# Patient Record
Sex: Male | Born: 1955 | Race: White | Hispanic: No | Marital: Single
Health system: Southern US, Community
[De-identification: ages and names within clinical notes are randomized; demographics above are authoritative.]

## PROBLEM LIST (undated history)

## (undated) ENCOUNTER — Emergency Department (HOSPITAL_COMMUNITY): Admission: EM | Payer: Self-pay | Source: Home / Self Care

---

## 1999-02-17 ENCOUNTER — Ambulatory Visit (HOSPITAL_COMMUNITY): Admission: RE | Admit: 1999-02-17 | Discharge: 1999-02-17 | Payer: Self-pay | Admitting: Neurosurgery

## 1999-02-17 ENCOUNTER — Encounter: Payer: Self-pay | Admitting: Neurosurgery

## 1999-02-25 ENCOUNTER — Inpatient Hospital Stay (HOSPITAL_COMMUNITY): Admission: RE | Admit: 1999-02-25 | Discharge: 1999-02-26 | Payer: Self-pay | Admitting: Neurosurgery

## 1999-02-25 ENCOUNTER — Encounter: Payer: Self-pay | Admitting: Neurosurgery

## 1999-04-22 ENCOUNTER — Inpatient Hospital Stay (HOSPITAL_COMMUNITY): Admission: RE | Admit: 1999-04-22 | Discharge: 1999-04-25 | Payer: Self-pay | Admitting: Neurosurgery

## 2000-03-31 ENCOUNTER — Encounter: Admission: RE | Admit: 2000-03-31 | Discharge: 2000-03-31 | Payer: Self-pay | Admitting: Neurosurgery

## 2000-03-31 ENCOUNTER — Encounter: Payer: Self-pay | Admitting: Neurosurgery

## 2000-04-08 ENCOUNTER — Encounter: Payer: Self-pay | Admitting: Neurosurgery

## 2000-04-08 ENCOUNTER — Ambulatory Visit (HOSPITAL_COMMUNITY): Admission: RE | Admit: 2000-04-08 | Discharge: 2000-04-08 | Payer: Self-pay | Admitting: Neurosurgery

## 2000-04-30 ENCOUNTER — Encounter: Payer: Self-pay | Admitting: Neurosurgery

## 2000-05-04 ENCOUNTER — Encounter: Payer: Self-pay | Admitting: Neurosurgery

## 2000-05-04 ENCOUNTER — Ambulatory Visit (HOSPITAL_COMMUNITY): Admission: RE | Admit: 2000-05-04 | Discharge: 2000-05-05 | Payer: Self-pay | Admitting: Neurosurgery

## 2000-06-02 ENCOUNTER — Encounter (HOSPITAL_COMMUNITY): Admission: RE | Admit: 2000-06-02 | Discharge: 2000-07-02 | Payer: Self-pay | Admitting: Neurosurgery

## 2001-03-11 ENCOUNTER — Ambulatory Visit (HOSPITAL_COMMUNITY): Admission: RE | Admit: 2001-03-11 | Discharge: 2001-03-11 | Payer: Self-pay | Admitting: Neurosurgery

## 2001-03-11 ENCOUNTER — Encounter: Payer: Self-pay | Admitting: Neurosurgery

## 2001-03-17 ENCOUNTER — Encounter: Payer: Self-pay | Admitting: Neurosurgery

## 2001-03-18 ENCOUNTER — Encounter: Payer: Self-pay | Admitting: Neurosurgery

## 2001-03-18 ENCOUNTER — Inpatient Hospital Stay (HOSPITAL_COMMUNITY): Admission: RE | Admit: 2001-03-18 | Discharge: 2001-03-21 | Payer: Self-pay | Admitting: Neurosurgery

## 2005-04-08 ENCOUNTER — Ambulatory Visit (HOSPITAL_COMMUNITY): Admission: RE | Admit: 2005-04-08 | Discharge: 2005-04-08 | Payer: Self-pay | Admitting: Family Medicine

## 2005-04-28 ENCOUNTER — Ambulatory Visit (HOSPITAL_COMMUNITY): Admission: RE | Admit: 2005-04-28 | Discharge: 2005-04-28 | Payer: Self-pay | Admitting: Pulmonary Disease

## 2005-06-03 ENCOUNTER — Ambulatory Visit (HOSPITAL_COMMUNITY): Admission: RE | Admit: 2005-06-03 | Discharge: 2005-06-03 | Payer: Self-pay | Admitting: *Deleted

## 2005-06-17 ENCOUNTER — Inpatient Hospital Stay (HOSPITAL_COMMUNITY): Admission: EM | Admit: 2005-06-17 | Discharge: 2005-06-23 | Payer: Self-pay | Admitting: *Deleted

## 2005-07-14 ENCOUNTER — Ambulatory Visit: Payer: Self-pay | Admitting: Infectious Diseases

## 2005-07-14 ENCOUNTER — Encounter: Payer: Self-pay | Admitting: Emergency Medicine

## 2005-07-14 ENCOUNTER — Inpatient Hospital Stay (HOSPITAL_COMMUNITY): Admission: AD | Admit: 2005-07-14 | Discharge: 2005-07-29 | Payer: Self-pay | Admitting: Cardiovascular Disease

## 2005-11-05 ENCOUNTER — Inpatient Hospital Stay (HOSPITAL_COMMUNITY)
Admission: AD | Admit: 2005-11-05 | Discharge: 2005-11-17 | Payer: Self-pay | Admitting: Thoracic Surgery (Cardiothoracic Vascular Surgery)

## 2005-12-07 ENCOUNTER — Encounter
Admission: RE | Admit: 2005-12-07 | Discharge: 2005-12-07 | Payer: Self-pay | Admitting: Thoracic Surgery (Cardiothoracic Vascular Surgery)

## 2006-03-22 ENCOUNTER — Ambulatory Visit: Payer: Self-pay | Admitting: Thoracic Surgery (Cardiothoracic Vascular Surgery)

## 2006-06-21 ENCOUNTER — Emergency Department (HOSPITAL_COMMUNITY): Admission: EM | Admit: 2006-06-21 | Discharge: 2006-06-21 | Payer: Self-pay | Admitting: Obstetrics & Gynecology

## 2006-07-13 ENCOUNTER — Ambulatory Visit (HOSPITAL_COMMUNITY): Admission: RE | Admit: 2006-07-13 | Discharge: 2006-07-13 | Payer: Self-pay | Admitting: Family Medicine

## 2006-07-31 ENCOUNTER — Emergency Department (HOSPITAL_COMMUNITY): Admission: EM | Admit: 2006-07-31 | Discharge: 2006-07-31 | Payer: Self-pay | Admitting: Emergency Medicine

## 2006-10-08 ENCOUNTER — Ambulatory Visit: Payer: Self-pay | Admitting: Internal Medicine

## 2006-10-19 ENCOUNTER — Ambulatory Visit (HOSPITAL_COMMUNITY): Admission: RE | Admit: 2006-10-19 | Discharge: 2006-10-19 | Payer: Self-pay | Admitting: Internal Medicine

## 2006-10-19 ENCOUNTER — Encounter: Payer: Self-pay | Admitting: Internal Medicine

## 2006-10-19 ENCOUNTER — Ambulatory Visit: Payer: Self-pay | Admitting: Internal Medicine

## 2006-11-23 ENCOUNTER — Ambulatory Visit: Payer: Self-pay | Admitting: Internal Medicine

## 2007-06-16 ENCOUNTER — Inpatient Hospital Stay (HOSPITAL_COMMUNITY): Admission: EM | Admit: 2007-06-16 | Discharge: 2007-06-19 | Payer: Self-pay | Admitting: Emergency Medicine

## 2007-06-17 ENCOUNTER — Encounter (INDEPENDENT_AMBULATORY_CARE_PROVIDER_SITE_OTHER): Payer: Self-pay | Admitting: Family Medicine

## 2007-06-24 ENCOUNTER — Ambulatory Visit (HOSPITAL_COMMUNITY): Admission: RE | Admit: 2007-06-24 | Discharge: 2007-06-24 | Payer: Self-pay | Admitting: Family Medicine

## 2007-07-01 ENCOUNTER — Ambulatory Visit (HOSPITAL_COMMUNITY): Admission: RE | Admit: 2007-07-01 | Discharge: 2007-07-01 | Payer: Self-pay | Admitting: Family Medicine

## 2007-10-04 ENCOUNTER — Ambulatory Visit (HOSPITAL_COMMUNITY): Admission: RE | Admit: 2007-10-04 | Discharge: 2007-10-04 | Payer: Self-pay | Admitting: Family Medicine

## 2007-11-07 IMAGING — CR DG CHEST 1V PORT
1 series · 1 of 1 positions shown · non-contrast
Comparison: [DATE].

CLINICAL DATA: CABG, follow-up. 
 PORTABLE CHEST - 1 VIEW ? 06/19/05 (0050 HOURS):

[view not recorded]
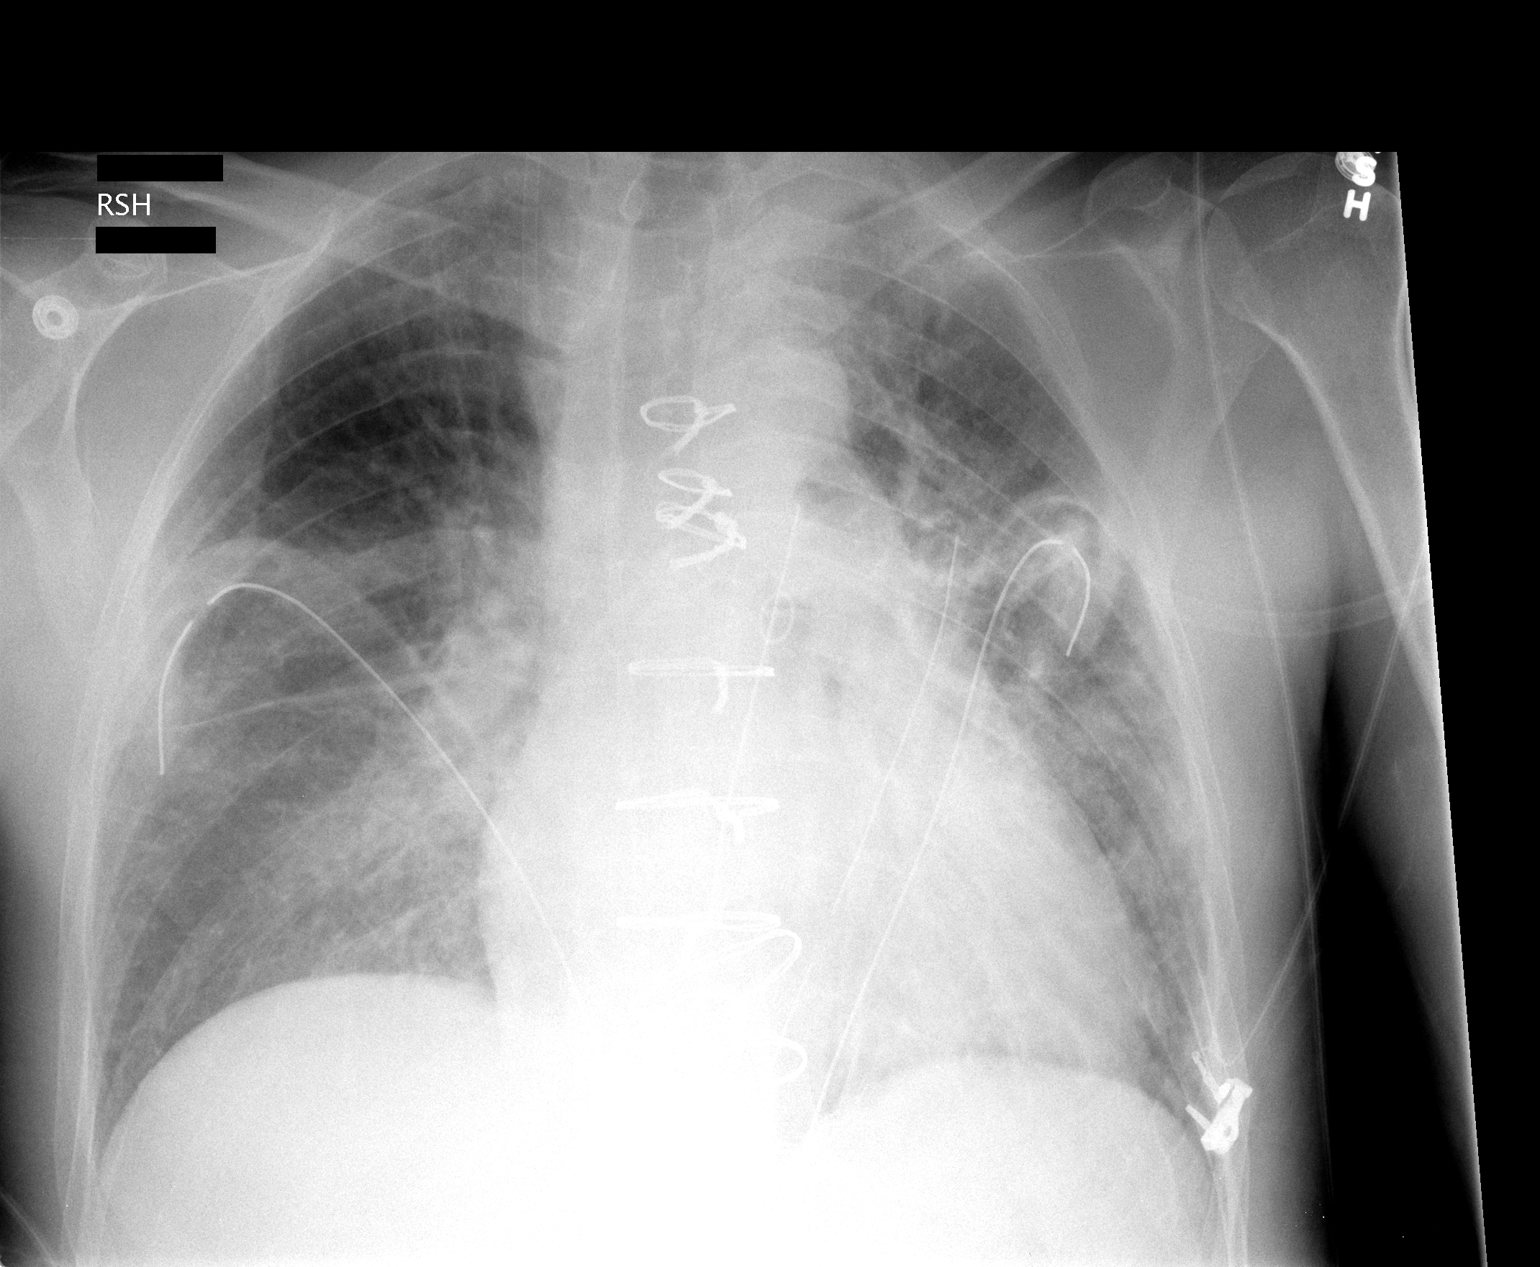

[1 of 1 positions shown; findings below may reference images not displayed]

Endotracheal tube has been removed.  A Swan-Ganz catheter has been removed but the venous access sheath remains in place.  Bilateral chest tubes and mediastinal drains remain in place.  There is no pneumothorax. There is worsening bilateral lung density which could be edema or aspiration.  I think edema is more likely.
IMPRESSION: As discussed above.

## 2007-11-10 IMAGING — CR DG CHEST 2V
2 series · 2 of 2 positions shown · non-contrast
Comparison: 06/21/05 and CT chest 04/28/05

CLINICAL DATA: Shortness of breath, chest pain and weakness.
 CHEST ? 2 VIEW:

[view not recorded (1 of 2)]
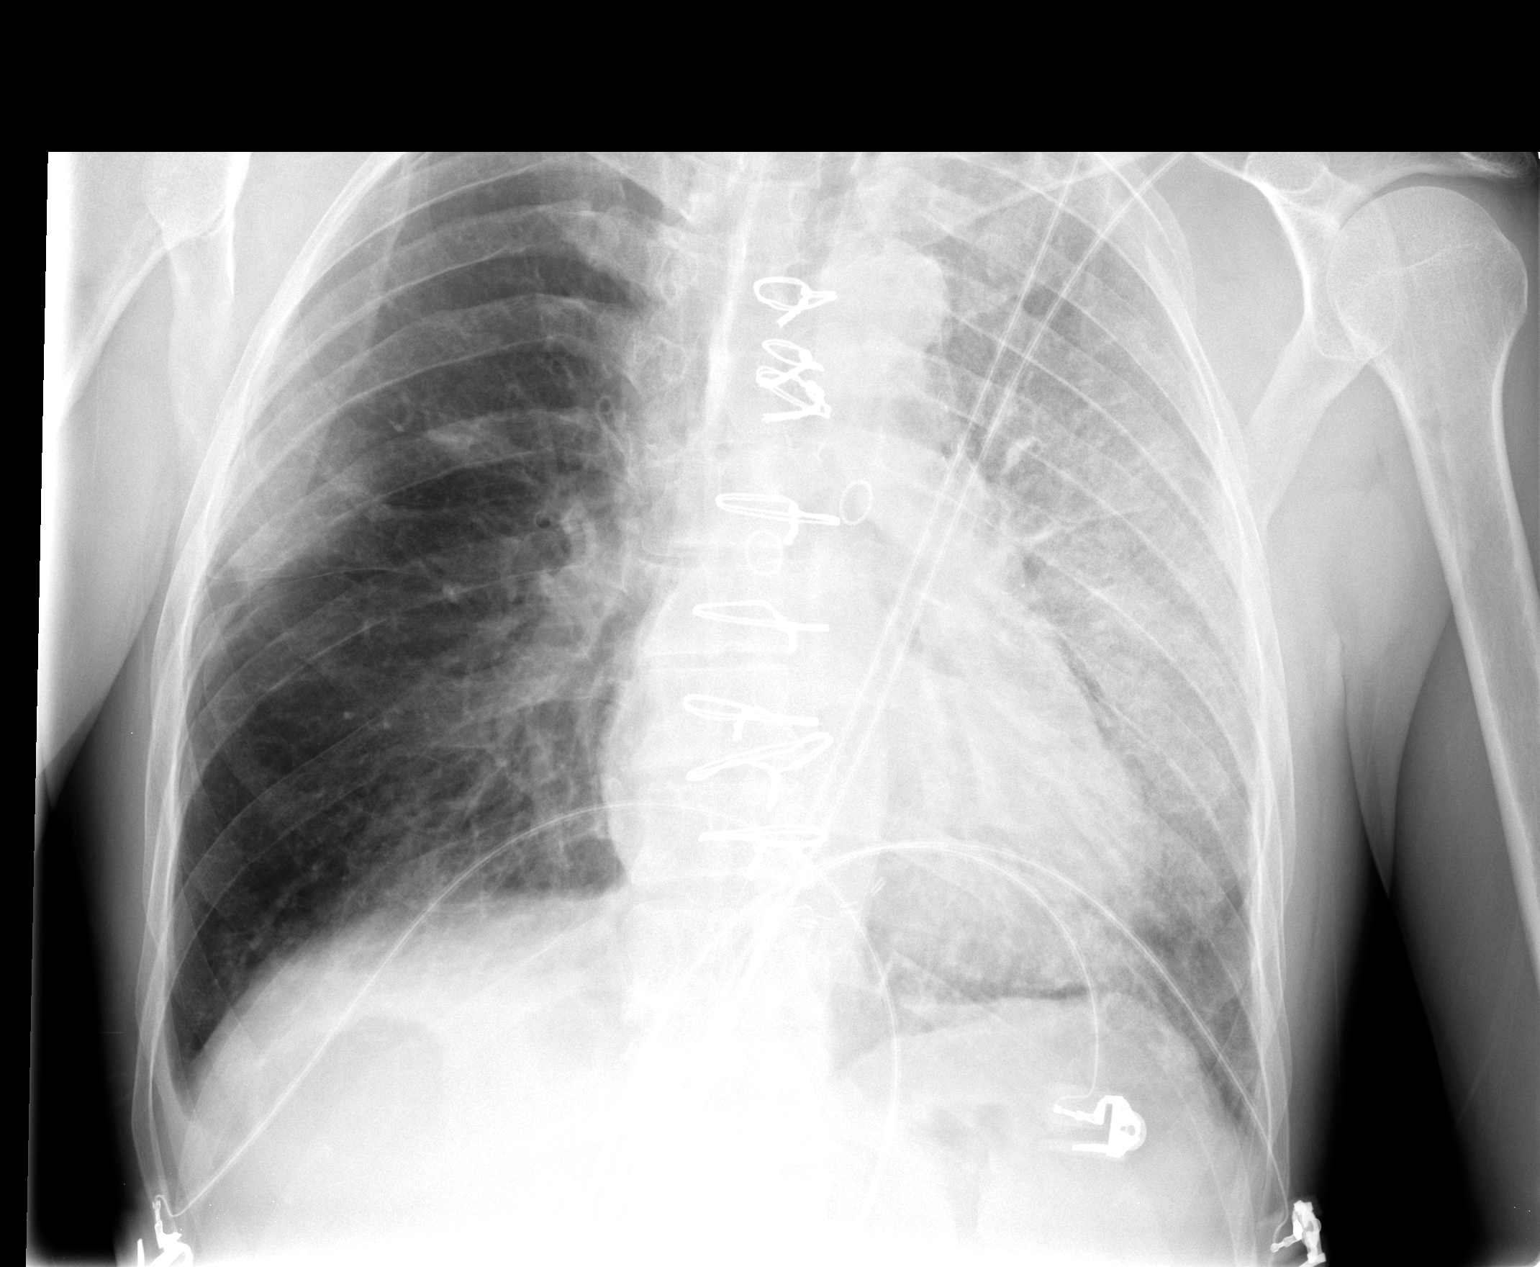

[view not recorded (2 of 2)]
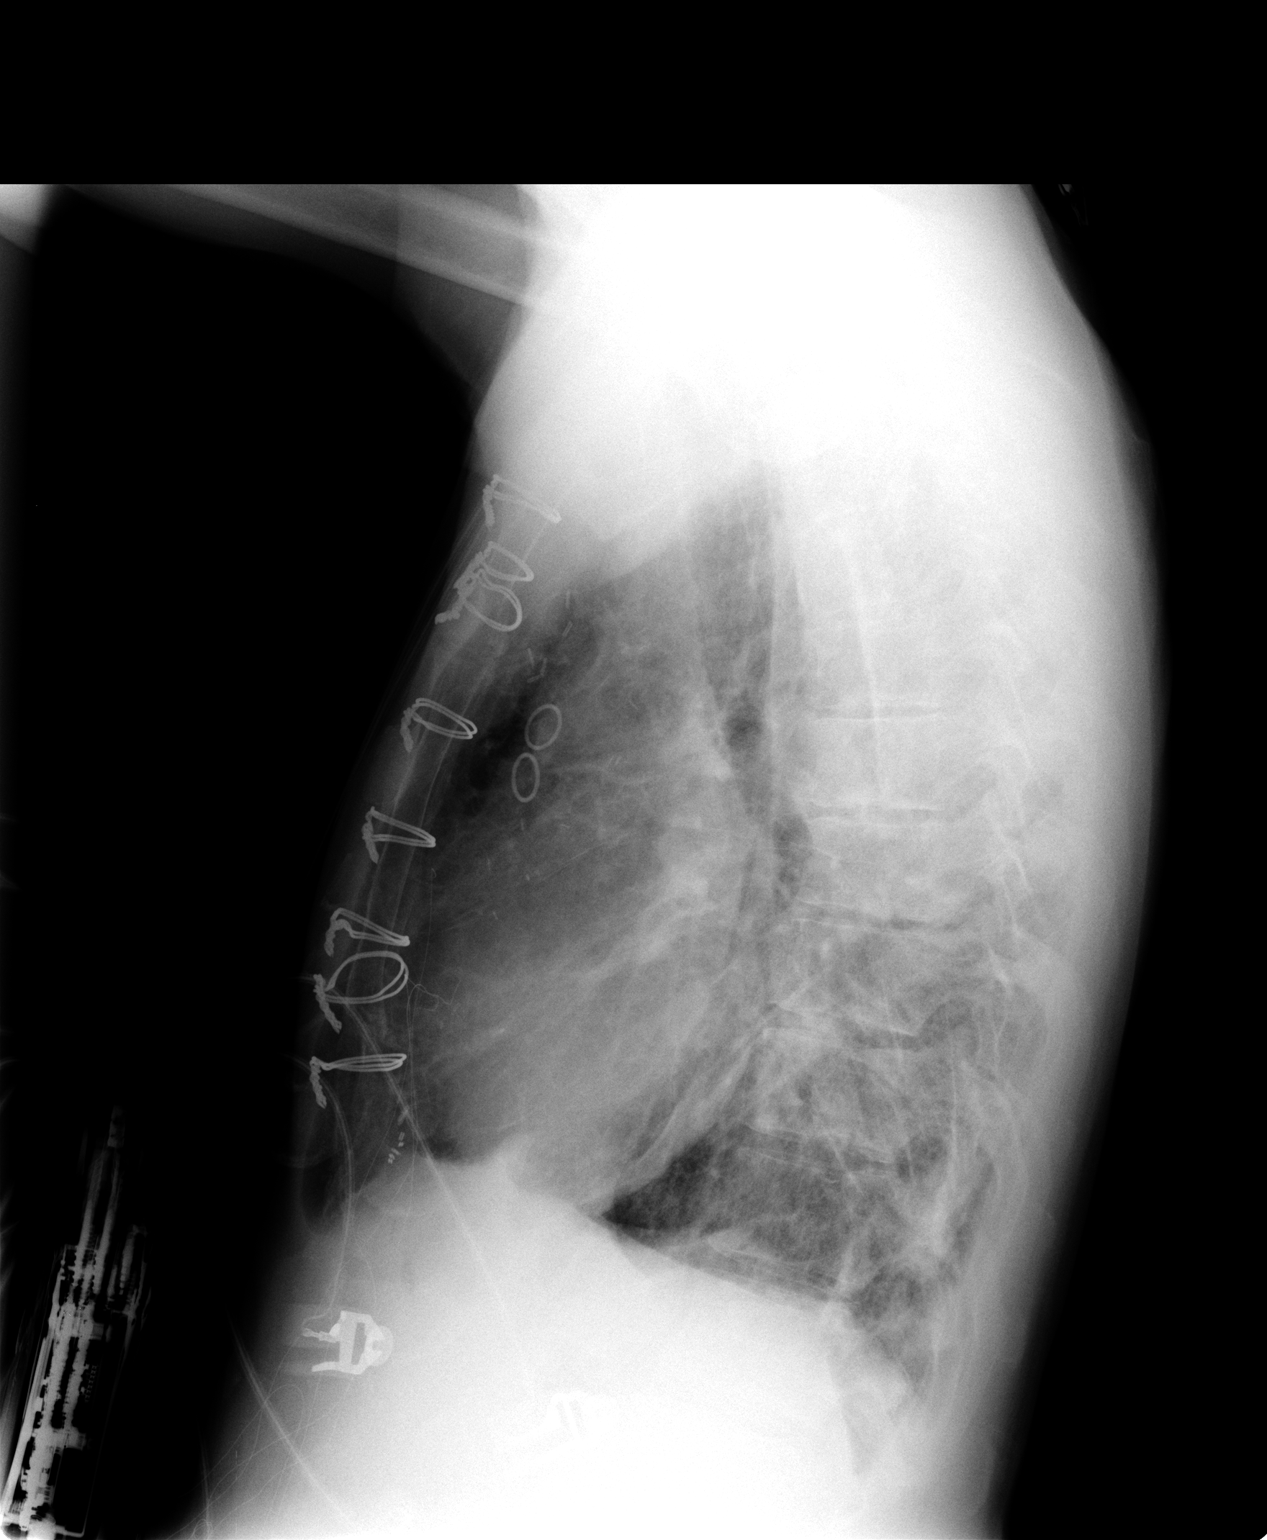

[2 of 2 positions shown; findings below may reference images not displayed]

FINDINGS: Extensive air space disease is seen in the left upper lobe.  Small bilateral pneumothoraces persist.  Air space disease is also seen in the posterior segment of the right upper lobe.  Bibasilar atelectasis.  No pleural fluid.
IMPRESSION: 1.  Persistent left upper lobe air space disease.  Developing air space disease in the posterior segment right upper lobe.  Findings are worrisome for pneumonia.
 2.  Small bilateral pneumothoraces.

## 2008-04-13 ENCOUNTER — Ambulatory Visit (HOSPITAL_COMMUNITY): Admission: RE | Admit: 2008-04-13 | Discharge: 2008-04-13 | Payer: Self-pay | Admitting: Family Medicine

## 2008-08-05 ENCOUNTER — Emergency Department (HOSPITAL_COMMUNITY): Admission: EM | Admit: 2008-08-05 | Discharge: 2008-08-05 | Payer: Self-pay | Admitting: Emergency Medicine

## 2008-11-08 IMAGING — CR DG CHEST 2V
2 series · 2 of 2 positions shown · non-contrast
Comparison: 12/07/05.

CLINICAL DATA: Shortness of breath/weakness.  
 CHEST - 2 VIEW:

[view not recorded (1 of 2)]
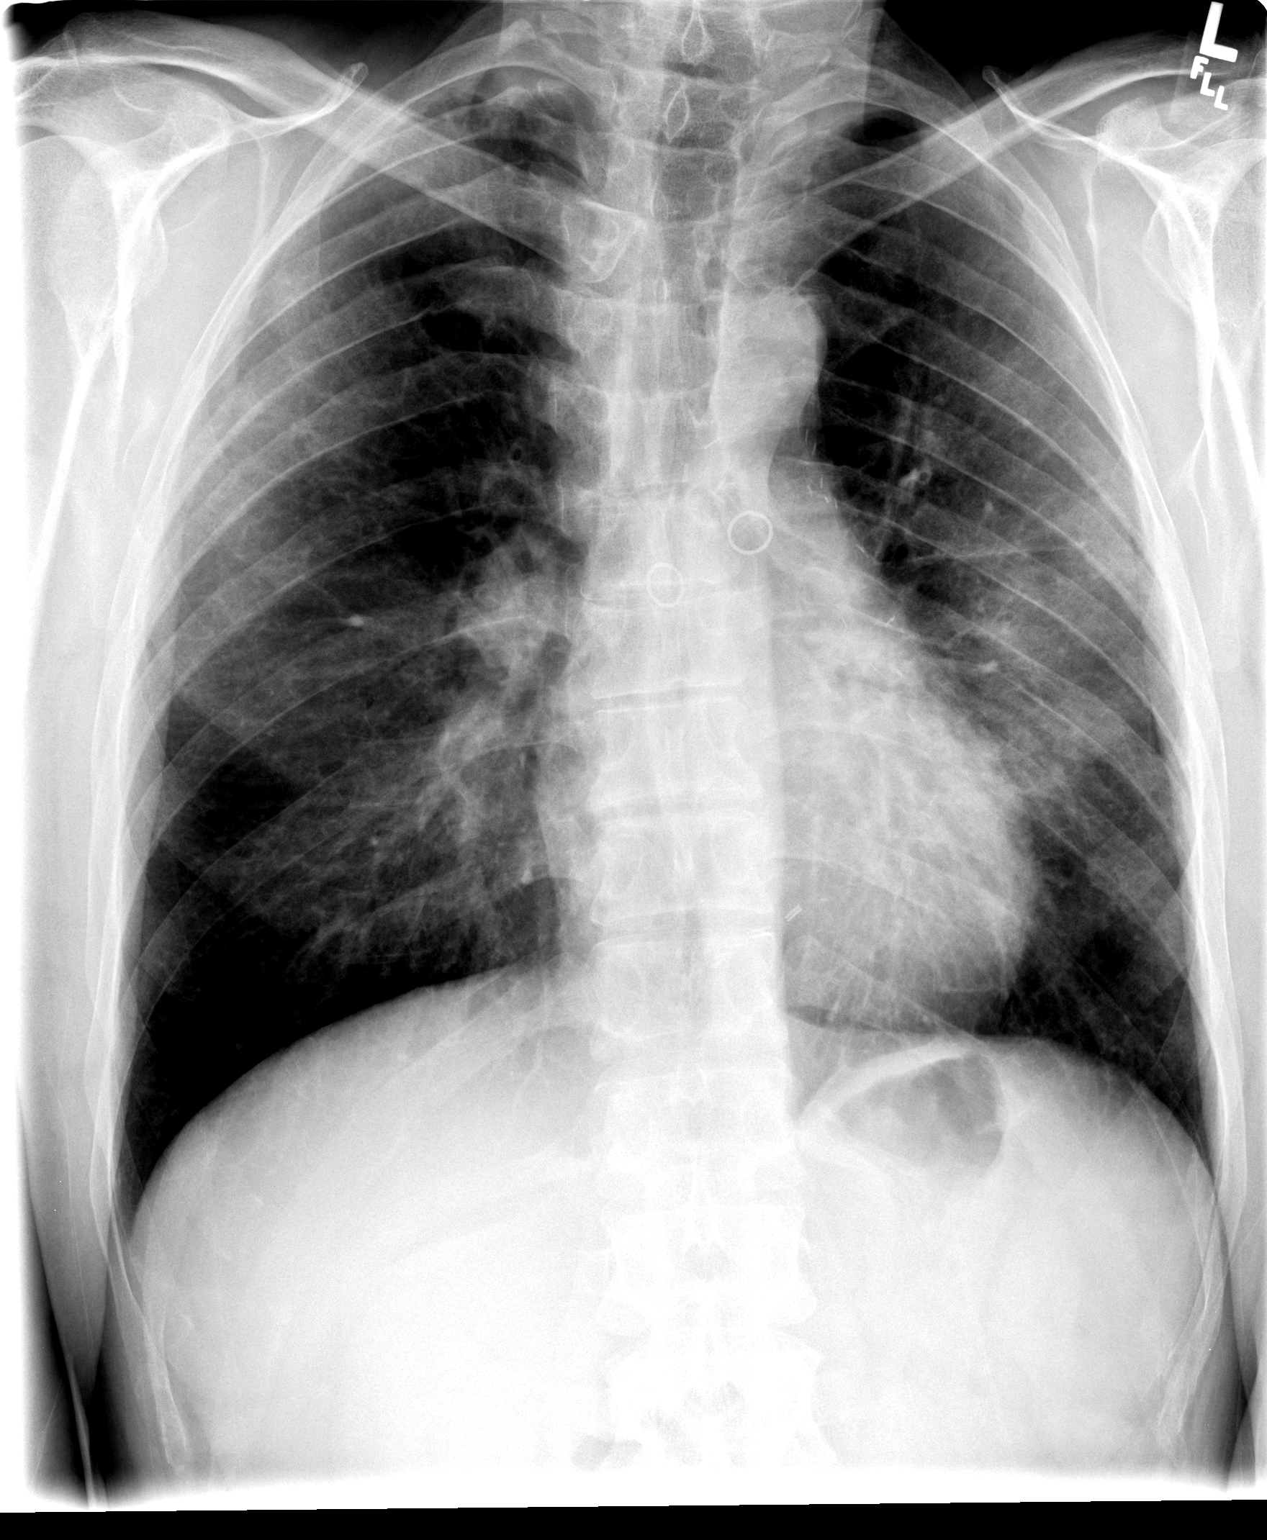

[view not recorded (2 of 2)]
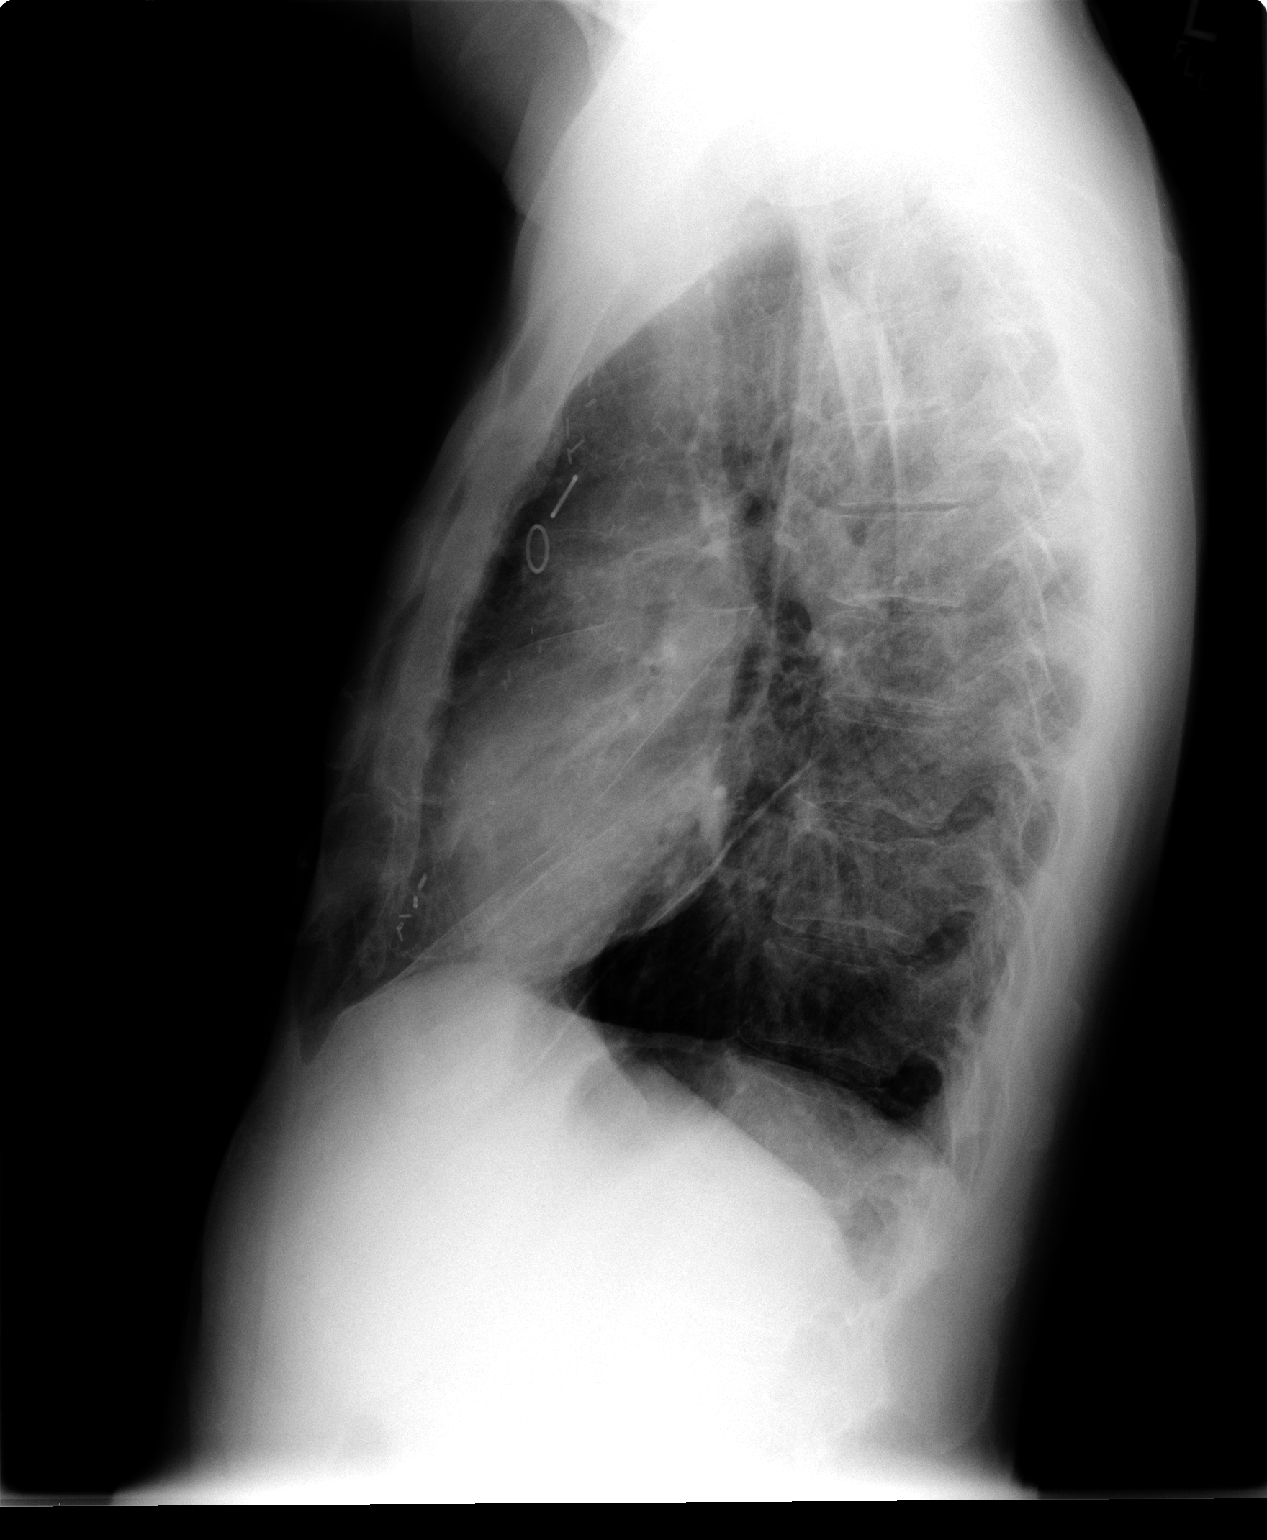

[2 of 2 positions shown; findings below may reference images not displayed]

FINDINGS: Heart size within normal limits.  Prior CABG.   There is faint pulmonary opacification in the right upper lobe and left perihilar region suspicious for acute pneumonia.  There is underlying COPD which is stable.
IMPRESSION: 1.  Faint right upper lobe and left perihilar infiltrates, likely acute pneumonia. 
 2.  Prior CABG and COPD.

## 2008-11-12 ENCOUNTER — Ambulatory Visit (HOSPITAL_COMMUNITY): Admission: RE | Admit: 2008-11-12 | Discharge: 2008-11-12 | Payer: Self-pay | Admitting: Cardiovascular Disease

## 2009-01-21 ENCOUNTER — Ambulatory Visit (HOSPITAL_COMMUNITY): Admission: RE | Admit: 2009-01-21 | Discharge: 2009-01-21 | Payer: Self-pay | Admitting: Family Medicine

## 2009-03-12 ENCOUNTER — Ambulatory Visit (HOSPITAL_COMMUNITY): Admission: RE | Admit: 2009-03-12 | Discharge: 2009-03-12 | Payer: Self-pay | Admitting: Family Medicine

## 2009-05-12 ENCOUNTER — Ambulatory Visit: Payer: Self-pay | Admitting: Cardiology

## 2009-10-11 ENCOUNTER — Inpatient Hospital Stay (HOSPITAL_COMMUNITY): Admission: EM | Admit: 2009-10-11 | Discharge: 2009-10-13 | Payer: Self-pay | Admitting: Emergency Medicine

## 2009-10-28 ENCOUNTER — Ambulatory Visit (HOSPITAL_COMMUNITY): Admission: RE | Admit: 2009-10-28 | Discharge: 2009-10-28 | Payer: Self-pay | Admitting: Family Medicine

## 2009-12-10 ENCOUNTER — Emergency Department (HOSPITAL_COMMUNITY): Admission: EM | Admit: 2009-12-10 | Discharge: 2009-12-10 | Payer: Self-pay | Admitting: Emergency Medicine

## 2010-03-02 ENCOUNTER — Encounter: Payer: Self-pay | Admitting: Thoracic Surgery (Cardiothoracic Vascular Surgery)

## 2010-03-12 DEATH — deceased

## 2010-04-22 LAB — BLOOD GAS, ARTERIAL
Bicarbonate: 21.4 mEq/L (ref 20.0–24.0)
FIO2: 0.21 %
Patient temperature: 37
pCO2 arterial: 31 mmHg — ABNORMAL LOW (ref 35.0–45.0)
pH, Arterial: 7.454 — ABNORMAL HIGH (ref 7.350–7.450)

## 2010-04-22 LAB — CBC
HCT: 39 % (ref 39.0–52.0)
MCHC: 34 g/dL (ref 30.0–36.0)
Platelets: 180 10*3/uL (ref 150–400)
RDW: 14.7 % (ref 11.5–15.5)
WBC: 8.8 10*3/uL (ref 4.0–10.5)

## 2010-04-22 LAB — COMPREHENSIVE METABOLIC PANEL
AST: 14 U/L (ref 0–37)
Albumin: 3.8 g/dL (ref 3.5–5.2)
Alkaline Phosphatase: 60 U/L (ref 39–117)
BUN: 20 mg/dL (ref 6–23)
Chloride: 108 mEq/L (ref 96–112)
Potassium: 3.4 mEq/L — ABNORMAL LOW (ref 3.5–5.1)
Sodium: 141 mEq/L (ref 135–145)
Total Protein: 6.7 g/dL (ref 6.0–8.3)

## 2010-04-22 LAB — DIFFERENTIAL
Eosinophils Absolute: 0.5 10*3/uL (ref 0.0–0.7)
Lymphs Abs: 1.6 10*3/uL (ref 0.7–4.0)
Monocytes Absolute: 0.5 10*3/uL (ref 0.1–1.0)
Monocytes Relative: 6 % (ref 3–12)
Neutrophils Relative %: 69 % (ref 43–77)

## 2010-04-22 LAB — POCT CARDIAC MARKERS
CKMB, poc: <1 ng/mL — ABNORMAL LOW (ref 1.0–8.0)
Troponin i, poc: <0.05 ng/mL (ref 0.00–0.09)

## 2010-04-24 LAB — CULTURE, BLOOD (ROUTINE X 2)
Culture: NO GROWTH
Report Status: 9072011

## 2010-04-24 LAB — URINE CULTURE

## 2010-04-24 LAB — IRON AND TIBC
Iron: 99 ug/dL (ref 42–135)
Saturation Ratios: 39 % (ref 20–55)
TIBC: 257 ug/dL (ref 215–435)
UIBC: 158 ug/dL

## 2010-04-24 LAB — URINALYSIS, ROUTINE W REFLEX MICROSCOPIC
Bilirubin Urine: NEGATIVE
Ketones, ur: NEGATIVE mg/dL
Nitrite: NEGATIVE
Protein, ur: NEGATIVE mg/dL
Urobilinogen, UA: 0.2 mg/dL (ref 0.0–1.0)

## 2010-04-24 LAB — BLOOD GAS, ARTERIAL
Bicarbonate: 20.2 mEq/L (ref 20.0–24.0)
TCO2: 18.3 mmol/L (ref 0–100)
pCO2 arterial: 33.5 mmHg — ABNORMAL LOW (ref 35.0–45.0)
pH, Arterial: 7.398 (ref 7.350–7.450)
pO2, Arterial: 147 mmHg — ABNORMAL HIGH (ref 80.0–100.0)

## 2010-04-24 LAB — CBC
HCT: 34.7 % — ABNORMAL LOW (ref 39.0–52.0)
Hemoglobin: 11.3 g/dL — ABNORMAL LOW (ref 13.0–17.0)
Hemoglobin: 11.6 g/dL — ABNORMAL LOW (ref 13.0–17.0)
MCH: 31.8 pg (ref 26.0–34.0)
MCHC: 33.4 g/dL (ref 30.0–36.0)
Platelets: 199 10*3/uL (ref 150–400)
Platelets: 202 10*3/uL (ref 150–400)
RBC: 3.67 MIL/uL — ABNORMAL LOW (ref 4.22–5.81)
RDW: 12.7 % (ref 11.5–15.5)
WBC: 13.5 10*3/uL — ABNORMAL HIGH (ref 4.0–10.5)
WBC: 6.7 10*3/uL (ref 4.0–10.5)

## 2010-04-24 LAB — COMPREHENSIVE METABOLIC PANEL
ALT: 18 U/L (ref 0–53)
Calcium: 9 mg/dL (ref 8.4–10.5)
Glucose, Bld: 83 mg/dL (ref 70–99)
Sodium: 136 mEq/L (ref 135–145)
Total Protein: 5.8 g/dL — ABNORMAL LOW (ref 6.0–8.3)

## 2010-04-24 LAB — BASIC METABOLIC PANEL
BUN: 11 mg/dL (ref 6–23)
CO2: 22 mEq/L (ref 19–32)
Chloride: 108 mEq/L (ref 96–112)
Creatinine, Ser: 0.95 mg/dL (ref 0.4–1.5)
GFR calc Af Amer: >60 mL/min (ref >60)
GFR calc non Af Amer: >60 mL/min (ref >60)
Glucose, Bld: 113 mg/dL — ABNORMAL HIGH (ref 70–99)
Potassium: 4 mEq/L (ref 3.5–5.1)
Sodium: 138 mEq/L (ref 135–145)
Sodium: 138 mEq/L (ref 135–145)

## 2010-04-24 LAB — FERRITIN: Ferritin: 305 ng/mL (ref 22–322)

## 2010-04-24 LAB — DIFFERENTIAL
Basophils Absolute: 0 10*3/uL (ref 0.0–0.1)
Eosinophils Relative: 1 % (ref 0–5)
Lymphocytes Relative: 11 % — ABNORMAL LOW (ref 12–46)
Lymphocytes Relative: 2 % — ABNORMAL LOW (ref 12–46)
Lymphocytes Relative: 5 % — ABNORMAL LOW (ref 12–46)
Lymphs Abs: 0.4 10*3/uL — ABNORMAL LOW (ref 0.7–4.0)
Lymphs Abs: 0.9 10*3/uL (ref 0.7–4.0)
Monocytes Absolute: 0.4 10*3/uL (ref 0.1–1.0)
Neutro Abs: 12.8 10*3/uL — ABNORMAL HIGH (ref 1.7–7.7)
Neutrophils Relative %: 94 % — ABNORMAL HIGH (ref 43–77)

## 2010-04-24 LAB — POCT CARDIAC MARKERS
CKMB, poc: 5 ng/mL (ref 1.0–8.0)
Myoglobin, poc: 149 ng/mL (ref 12–200)
Troponin i, poc: <0.05 ng/mL (ref 0.00–0.09)

## 2010-04-24 LAB — LACTIC ACID, PLASMA: Lactic Acid, Venous: 1.3 mmol/L (ref 0.5–2.2)

## 2010-04-24 LAB — CARDIAC PANEL(CRET KIN+CKTOT+MB+TROPI)
CK, MB: 2.2 ng/mL (ref 0.3–4.0)
CK, MB: 3.9 ng/mL (ref 0.3–4.0)
Relative Index: INVALID (ref 0.0–2.5)
Total CK: 63 U/L (ref 7–232)
Total CK: 78 U/L (ref 7–232)
Troponin I: 0.05 ng/mL (ref 0.00–0.06)

## 2010-04-24 LAB — ETHANOL: Alcohol, Ethyl (B): <5 mg/dL (ref 0–10)

## 2010-04-24 LAB — MRSA PCR SCREENING: MRSA by PCR: POSITIVE — AB

## 2010-05-19 LAB — CBC
HCT: 43.9 % (ref 39.0–52.0)
Hemoglobin: 15.3 g/dL (ref 13.0–17.0)
MCV: 96.8 fL (ref 78.0–100.0)
RBC: 4.54 MIL/uL (ref 4.22–5.81)
WBC: 13.4 10*3/uL — ABNORMAL HIGH (ref 4.0–10.5)

## 2010-05-19 LAB — DIFFERENTIAL
Eosinophils Absolute: 0.4 10*3/uL (ref 0.0–0.7)
Eosinophils Relative: 3 % (ref 0–5)
Lymphs Abs: 0.8 10*3/uL (ref 0.7–4.0)
Monocytes Absolute: 0.2 10*3/uL (ref 0.1–1.0)
Monocytes Relative: 2 % — ABNORMAL LOW (ref 3–12)

## 2010-05-19 LAB — BASIC METABOLIC PANEL
BUN: 7 mg/dL (ref 6–23)
Chloride: 105 mEq/L (ref 96–112)
GFR calc Af Amer: >60 mL/min (ref >60)
Potassium: 3 mEq/L — ABNORMAL LOW (ref 3.5–5.1)

## 2010-05-19 LAB — POCT CARDIAC MARKERS
CKMB, poc: <1 ng/mL — ABNORMAL LOW (ref 1.0–8.0)
Myoglobin, poc: 72.4 ng/mL (ref 12–200)

## 2010-06-24 NOTE — Discharge Summary (Signed)
[name] [name], [name] [name]:  [id number]   MEDICAL RECORD NO.:  [id number]          PATIENT TYPE:  INP   LOCATION:  A222                          FACILITY:  [facility]   PHYSICIAN:  Lucita Ferrara, MD         DATE OF BIRTH:  Dec 19, 1955   DATE OF ADMISSION:  06/16/2007  DATE OF DISCHARGE:  05/10/2009LH                               DISCHARGE SUMMARY   ADDENDUM   Note, the patient is to hold home medication dose of nitroglycerin due  to his presentation of hypotension upon admission date.      Lucita Ferrara, MD  Electronically Signed     RR/MEDQ  D:  06/19/2007  T:  06/19/2007  Job:  [id number]

## 2010-06-24 NOTE — Group Therapy Note (Signed)
NAME:  [name] [name], [name] [name]:  [id number]   MEDICAL RECORD NO.:  [id number]          PATIENT TYPE:  INP   LOCATION:  A222                          FACILITY:  [facility]   PHYSICIAN:  Lucita Ferrara, MD         DATE OF BIRTH:  1955-10-31   DATE OF PROCEDURE:  DATE OF DISCHARGE:                                 PROGRESS NOTE   Note:  Physical examination, previous progress notes reviewed.  The  patient denies any recurrent blackout episodes.  Denies any chest pain  or shortness of breath.  Denies any weakness.  He was already evaluated  by neurology.  EKG shows no arrhythmias, no events.  Carotid ultrasound:  Right carotid artery shows no stenosis.  The left, no stenosis.  Impression was mild bilateral carotid bulb plaque without any evidence  of hemodynamically significant stenosis.  CT of the head negative.   OBJECTIVE:  The patient's temperature 97.8, pulse 72, respirations 16,  blood pressure 133/78, pulse oximetry 99% on room air.  HEENT:  Normocephalic, atraumatic.  Sclerae anicteric.  CARDIOVASCULAR:  S1, S2.  Regular rate and rhythm.  ABDOMEN:  Soft, nontender, nondistended, positive bowel sounds.  LUNGS:  Clear to auscultation.  No rhonchi, rales or wheezes.  NEUROLOGIC:  The patient alert and oriented x3.  Cranial nerves II-XII  grossly intact.   LABORATORY DATA:  The patient had an RPR negative.  Folate level 9.1,  within normal limits.  T3 normal.  Vitamin B12 normal.  Free T4 low at  8.82.  C-reactive protein 0.3, low.  Magnesium 1.9.  Complete metabolic  panel shows a mildly low blood albumin of 3.0.  Again, his TSH is 0.253,  which is mildly low.  Radiological results above.  Again, his carotid  Doppler is negative.  CT scan of the head negative.  Initial EKG within  normal limits.   ASSESSMENT AND PLAN:  1. Syncopal episode and blacking out that is sudden.  Unclear      etiology.  Seems like his metabolic workup including his Z61 and      folate is within  normal limits.  Although he is clinically folate      deficient, given his chronic alcohol use, his syncopal episode      could just well be secondary to this or to active marijuana use.      Rule out seizure disorder.  The patient will be scheduled for an      EEG.  The rest of the plan will be dependent on neurology      recommendations if his EEG is positive.  2. Mild protein-calorie malnutrition.  Suspect this is likely      secondary to his alcohol use.  Will go ahead and get a dietary      consult.  Also, the patient was empirically initiated on B12 injection and  thiamine.  Will continue PT/OT, get the rest of his medications at home.   Note that his cardiogenic reason for any kind of syncope was pretty much  ruled out with a negative echocardiogram,  consultation that recommended  no further workup.  The patient's discharge will be in the next 1 or 2  days, depending on his EEG results.  Thereafter, discharge plan  dependent of patients progress.      Lucita Ferrara, MD  Electronically Signed     RR/MEDQ  D:  06/18/2007  T:  06/18/2007  Job:  [id number]

## 2010-06-24 NOTE — Discharge Summary (Signed)
[name], [name] [name]                 ACCOUNT NO.:  [id number]   MEDICAL RECORD NO.:  [id number]          PATIENT TYPE:  INP   LOCATION:  A222                          FACILITY:  [facility]   PHYSICIAN:  Lucita Ferrara, MD         DATE OF BIRTH:  06/04/1955   DATE OF ADMISSION:  06/16/2007  DATE OF DISCHARGE:  LH                               DISCHARGE SUMMARY   ADDENDUM:   DISCHARGE MEDICATIONS:  1. Amitriptyline 50 mg p.o. nightly.  2. Metoprolol 50 mg p.o. b.i.d.  3. Zocor 20 mg p.o. nightly.  4. Thiamine 100 mg p.o. daily.  5. Valium 5 mg p.o. q.12 hours as needed.  6. Advair Diskus, per admission dose prior to admission, twice daily.  7. Simvastatin 20 mg p.o. daily.  8. Megestrol 40 mg p.o. b.i.d.  9. Ferrous sulfate 225 mg once daily.  10.Neurontin 300 mg three times a day.  11.Folic acid 1 mg p.o. daily.   Note that entire medications should be reconciliated and doses should be  verified by primary care doctor.      Lucita Ferrara, MD  Electronically Signed     RR/MEDQ  D:  06/19/2007  T:  06/19/2007  Job:  [id number]   cc:   Patrica Duel, M.D.  Fax: [phone]

## 2010-06-24 NOTE — Consult Note (Signed)
NAME:  [name] [name], [name] [name]                 ACCOUNT NO.:  [id number]   MEDICAL RECORD NO.:  [id number]          PATIENT TYPE:  INP   LOCATION:  A222                          FACILITY:  [facility]   PHYSICIAN:  Kofi A. Gerilyn Pilgrim, M.D. DATE OF BIRTH:  11-20-1955   DATE OF CONSULTATION:  06/18/2007  DATE OF DISCHARGE:                                 CONSULTATION   REASON FOR CONSULTATION:  Syncope.   HISTORY OF PRESENT ILLNESS:  The patient is a 55 year old white male who  presents to the hospital with about 3 spells of syncope.  He had a  recent event, which led him to be taken to the emergency room.  He  endorses drinking alcoholic beverages, although he said it was just 1  beer the day that the most latest event occurred.  He was found by EMS  to be hypotensive however and was given 500 liters of fluid en route to  the emergency room.  The patient apparently fell during the most recent  event and reports low back pain.  The patient's other events seems  similar in semiology to the most recent event.  He probably just blacks  out and loses consciousness.  He thinks he is out for a few minutes.  This spell has been witnessed by his family.  He indicates that no one  has told him that he shakes, jerks, just simply blacks out.  There is no  trauma to the tongue or lip.  There is no urinary incontinence.  No  bowel incontinence.  No focal neurological signs, diplopia, or chest  pain.  No shortness of breath associated with these spells.  He does  report to having chronic headaches from time to time.   PAST MEDICAL HISTORY:  1. Myocardial infarction/coronary artery disease.  2. Hypertension.  3. Hyperlipidemia.  4. Gastroesophageal reflux disease  5. MRSA.   ALLERGIES:  None known.   ADMISSION MEDICATIONS:  1. Metoprolol 50 mg b.i.d.  2. Folic acid 1 mg daily.  3. Amitriptyline 50 mg at bedtime.  4. Neurontin 300 mg t.i.d.  5. Ferrous sulfate 325 daily.  6. Megace 40 mg b.i.d.  7. Simvastatin  20 mg once a day.  8. Advair.  9. Nitroglycerin p.r.n.  10.Valium 10 mg p.r.n.  11.Sertraline.  12.Omeprazole 20 mg  13.Vicodin 5.   SOCIAL HISTORY:  Currently smokes.  Charts indicate that he is a heavy  drinker, although the patient reports only drinking a couple of beers or  so few times a week.  He denies heavy liquor drinking.   REVIEW OF SYSTEMS:  No fevers.  No focal neurological symptoms.  He does  report dizziness and lightheadedness with these events, but denies  diplopia.  Otherwise, review of systems are unchanged from Dr. Claudean Severance  on Jun 16, 2007.   PHYSICAL EXAMINATION:  A thin, somewhat malnourish-appearing man, in no  acute distress.  Temperature 97.6, pulse 72, respirations 18, and blood pressure 111/66.  HEENT:  Head is normocephalic and atraumatic.  NECK:  Supple.  ABDOMEN:  Soft.  CHEST:  Midline sternal incision appears  to have been healed by  secondary intention.  EXTREMITIES:  No significant edema.  NEUROLOGIC:  Mentation, the patient is awake and alert.  He converses  and is lucid.  He does seem to have some restricted insight into his  medical condition.  Speech, language, and cognition generally  unremarkable.  Cranial nerve evaluation:  The patient has significant  gaze-evoked choppy nystagmus in all directions.  He does seem to have  some apraxia of eye movement.  He does have full extraocular movements;  however, visual fields are intact.  Pupils are equal, round, and  reactive to light.  Facial muscle strength is symmetric and normal.  Tongue is midline.  Shoulder shrug is normal.  Motor examination shows  normal bulk and strength.  There appears to be some slightly increased  tone in the legs.  Reflexes are brisk in the legs and slightly brisk in  the upper extremities.  Cognition shows no past-pointing or dysmetria.  There is no parkinsonism.  Gait is wide based, but there is no  spasticity and actually relatively stable.  Ambulation without  assistive  devices.   Head CT scan is normal.  Carotid Dopplers shows no hemodynamic  significant stenosis.   IMPRESSION:  1. Recurrent syncope of unclear etiology.  Given the hypotension and      the history of alcohol use as heavy, I suggested that this is      likely causative.  I doubt that he has a primary neurologic problem      explaining his syncope; however, given the relatively poor history,      probably reasonable to get an EEG.  2. Clinical evidence of thiamine deficiency.  We will go ahead and      replace thiamine starting tonight.  I will suggest that he takes      this chronically given his chronic alcoholic abuse.  3. Subtle signs of myelopathy in clinical evaluation.  This is also      could be due to nutritional deficiency particularly from vitamin      B12.  We will check the levels and have this replaced.   Thank you for this consultation.      Kofi A. Gerilyn Pilgrim, M.D.  Electronically Signed     KAD/MEDQ  D:  06/18/2007  T:  06/18/2007  Job:  [id number]

## 2010-06-24 NOTE — Op Note (Signed)
NAME:  [name] [name], [name] [name]                 ACCOUNT NO.:  [id number]   MEDICAL RECORD NO.:  [id number]          PATIENT TYPE:  AMB   LOCATION:  DAY                           FACILITY:  [facility]   PHYSICIAN:  R. Roetta Sessions, M.D. DATE OF BIRTH:  1955/08/25   DATE OF PROCEDURE:  10/19/2006  DATE OF DISCHARGE:                               OPERATIVE REPORT   PROCEDURE:  Colonoscopy screening followed by esophagogastroduodenoscopy  with biopsy.   INDICATIONS FOR PROCEDURE:  The patient is 55 year old gentleman sent  over courtesy of Dr. Patrica Duel for further evaluation of anemia.  He  has a history of normocytic anemia and as far as I know, there has been  no documentation he has been GI bleeding.  In fact he has no upper or  lower GI tract symptoms.  There is no family history of colorectal  neoplasia.  He has never had his lower GI tract evaluated.  Colonoscopy  is now being done largely as a screening maneuver.  His iron studies  been found to be normal.  If colonoscopy is unrevealing we will plan for  an EGD.  This approach has been discussed the patient previously.  Potential risks, benefits and alternatives have been reviewed, questions  answered.  He is agreeable.  Please see the documentation medical  record.  He does have a history of a gastric ulcer disease found in 1997  Dr. Karilyn Cota.  He was previously treated for H pylori based on serologies.  He is status post triple drug therapy.   PROCEDURE NOTE:  O2 saturation, blood pressure, pulse and respirations  were monitored throughout entirety of procedure.   CONSCIOUS SEDATION:  Versed 7 mg IV, Demerol 150 mg IV in divided doses  for both procedures.   INSTRUMENTATION:  Pentax video chip system.   FINDINGS:  Digital rectal exam revealed no abnormalities.   ENDOSCOPIC FINDINGS:  The prep was good.  Colon:  Colonic mucosa was surveyed from rectosigmoid junction through  the left, transverse, right colon to the area appendiceal  orifice  ileocecal valve and cecum.  These structures well seen and photographed  for the record.  Terminal ileum is intubated to 10 cm, from this level  scope was withdrawn.  All previous mucosal surfaces were again seen.  The colonic mucosa, terminal ileum mucosa appeared normal.  Scope was  pulled down to the rectum.  Thorough exam of rectal mucosa including  retroflex view of the anal verge demonstrated no abnormalities.  The  patient tolerated procedure well was prepared for EGD.   FINDINGS:  Examination of tubular esophagus revealed a 5 cm solitary  distal esophageal erosion going down the EG junction where it  intersected with a 5 cm EG junction ulcer.  There is no Barrett's  esophagus.  There was a noncritical Schatzki's ring.  There was no  evidence of tumor.  EG junction easily traversed.  Stomach:  Gastric cavity was empty, insufflated well with air.  Thorough  examination gastric mucosa including retroflexion proximal stomach,  esophagogastric junction demonstrated a small hiatal hernia and multiple  tiny antral  erosions with superficial ulceration.  Please see photos.  These appeared to be benign lesions.  There was no infiltrating process.  Pylorus patent, easily traversed.  Examination bulb, second portion  revealed no abnormalities.   THERAPEUTIC/DIAGNOSTIC MANEUVERS PERFORMED:  Antral biopsies were taken  for histologic study.  The patient tolerated the procedure well, was  reacted in endoscopy.   IMPRESSION:  Colonoscopy findings:  Normal rectum, colon, terminal  ileum.  EGD findings:  Ulcerative/erosive reflux esophagitis,  noncritical Schatzki's ring not manipulated, no Barrett's esophagus,  small hiatal hernia.  Multiple antral erosions and superficial  ulceration biopsied, patent pylorus, normal D1, D2.   Although rip roaring reflux esophagitis and gastric erosions could  produce anemia via a slow GI bleed, we do not have any evidence that he  has had any GI  bleeding.  I suspect more his anemia is related anemia of  chronic disease.  He needs be treated for gastroesophageal reflux  disease.   RECOMMENDATIONS:  1. Begin Prilosec 20 grams orally daily.  2. Will review the histology when it becomes available.  3. Recommend repeat screening colonoscopy in 10 years.  4. If his anemia does not resolve and there is no evidence GI      bleeding, then further potential other non GI causes of anemia      should be undertaken.  5. I have instructed Mr. Maclellan to followed by Dr. Nobie Putnam as      scheduled.      Jonathon Bellows, M.D.  Electronically Signed     RMR/MEDQ  D:  10/19/2006  T:  10/20/2006  Job:  [id number]   cc:   Patrica Duel, M.D.  Fax: [phone]

## 2010-06-24 NOTE — Group Therapy Note (Signed)
[name], [name] NO.:  [id number]   MEDICAL RECORD NO.:  [id number]          PATIENT TYPE:  INP   LOCATION:  A222                          FACILITY:  [facility]   PHYSICIAN:  Lucita Ferrara, MD         DATE OF BIRTH:  1955/06/26   DATE OF PROCEDURE:  06/17/2007  DATE OF DISCHARGE:                                 PROGRESS NOTE   Four history and physical examination progress notes reviewed.  The  patient presented actually yesterday with a history of a fall and he has  been having these blackout periods in the last week a couple of times.  He was brought to the emergency room where he was found to be  hypotensive and was given some fluids.  The patient is a current alcohol  user.   PAST MEDICAL HISTORY:  Includes having history of coronary artery  disease.  He had a myocardial infarction and CABG back in 2005.   Objectively his laboratory data, his TSH is low at 0.253.  Basic  metabolic panel is within normal limits except mildly elevated chloride  of 116.  Beta natriuretic peptide normal 63.5.  His CBC shows a white  count 7.1, hemoglobin 12.1, hematocrit 34.1, platelets 154, ESR 10,  cardiac enzymes negative x3, INR 1.2, CRP 3.7.  Blood alcohol level less  than 5.  ESR 10.  Urine drug screen positive for benzodiazepines,  positive for THC.  CT scan of the head shows negative head CT.   ASSESSMENT AND PLAN:  A 55 year old with:  1. Status post syncopal episode.  2. Hypotension.  3. History of ethanol abuse.  4. Questionable chest pain.   The patient's syncopal episode could be multifactorial in etiology  although given his extensive cardiac history must rule out any  cardiogenic cause.  The patient also had an echocardiogram which  essentially showed no significant valvular heart disease.  There was no  cardiographic evidence of cardiac source of embolism.  Ejection fraction  of 60%.  The patient's EKG was also normal sinus rhythm.  Cardiology was  consulted as  well.  We will go ahead and follow with carotid Doppler  results and if there is any significant abnormalities we will go ahead  and pursue further recommendations and possibly get a neurology  consultation to rule out seizure disorder.  Note that the patient's  urine drug screen is also positive for [facility] and if he is an active  marijuana user in combination with alcohol then this can also lead to  changes in mental status.  We will go ahead and complete a metabolic  panel with a B12, RPR.  Given his slightly low TSH level we will go  ahead and get a free T3, T4.  Again, a combination of alcohol and being  on  benzodiazepines can definitely cause syncopal episodes.  May consider  titrating some of his medications down.  I would be worried about  withdrawal at this point however.  I will initiate metabolic workup.  May consider getting MRI at some point.  The  patient's care is currently  ongoing.      Lucita Ferrara, MD  Electronically Signed     RR/MEDQ  D:  06/17/2007  T:  06/17/2007  Job:  [id number]

## 2010-06-24 NOTE — Consult Note (Signed)
NAME:  [name] [name], [name] [name]                 ACCOUNT NO.:  [id number]   MEDICAL RECORD NO.:  [id number]          PATIENT TYPE:  AMB   LOCATION:  DAY                           FACILITY:  [facility]   PHYSICIAN:  R. Roetta Sessions, M.D. DATE OF BIRTH:  11-Apr-1955   DATE OF CONSULTATION:  10/08/2006  DATE OF DISCHARGE:                                 CONSULTATION   REASON FOR CONSULTATION:  Anemia, colonoscopy.   HISTORY OF PRESENT ILLNESS:  The patient is a 55 year old gentleman sent  by Dr. Patrica Duel for further evaluation of anemia.  The patient has  never had a colonoscopy.  He states he has been on oral iron therapy  since his coronary artery bypass surgery in May 2007.  He states  initially he was one daily.  Now this has been increased to two daily.  He has cancelled appointments with as previously.  He cancelled last  month's appointment with [place] because he was scared of having colonoscopy.  He is scared it would hurt.  He is back now for further evaluation.  He  was seen in emergent department on June 21 for shortness of breath.  He  was also seen in May for the same thing.  In May, he was found to have a  faint right upper lobe and left perihilar infiltrate, likely acute  pneumonia.  He was treated as an outpatient.  He denies any ongoing  shortness of breath or chest pain.[name]  His last hemoglobin was 10.4,  hematocrit 30.2, MCV 89.8.  This was on July 31, 2006.  In May his  hemoglobin was 10.6.  On July 13, 2006, his hemoglobin was 12.0 as an  outpatient.  B12 is 951, folate greater than 20.  Iron 126, TIBC 306,  saturations 41%, ferritin 173.   He generally has a bowel movement every 3 days.  He has chronic  constipation.  He denies any melena or rectal bleeding, abdominal pain,  nausea or vomiting, heartburn, dysphagia odynophagia.  He states his  weight has fluctuated.  When he had his MI last year, his weight was in  the 130 range.  He got up to 160, but is now back down to 147.   CURRENT MEDICATIONS:  1. Aspirin 325 mg daily.  2. Multivitamin daily.  3. Neurontin 300 mg t.i.d.  4. Amitriptyline 50 mg q.h.s.  5. Simvastatin 20 mg daily.  6. Folic acid 1 mg daily.  7. Megace 40 mg daily.  8. Ferrous sulfate two daily.  9. Metoprolol 50 mg half tablet b.i.d.  10.Hydrocodone p.r.n.   ALLERGIES:  No known drug allergies.   PAST MEDICAL HISTORY:  1. Hypertension.  2. Hypercholesterolemia.  3. Coronary artery disease status post coronary artery bypass graft      Jun 17, 2005.  He had three-vessel coronary artery disease with      acute angioplasty failure and acute dissection of the left anterior      descending coronary artery evolving into an acute MI.  He had      emergency median sternotomy for coronary artery bypass  graft, three      vessels.  His course has been complicated by requiring mediastinal      reexploration for bleeding.  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  He      became debilitated and had to go to nursing home for some period      time.  Last surgery was in September 2007.  4. Chronic anemia.  5. History of gastric ulcer in 1997, found on EGD by Dr. Karilyn Cota.  H.      Pylori serologies were positive and he underwent triple drug      therapy.  6. He has tracheostomy at age 33 for acute asphyxiation      hemorrhoidectomy.   FAMILY HISTORY:  Mother is 17 and has Alzheimer's disease, history of  seizures, diabetes, heart problems.  Father deceased at age 68 of lung  cancer.  No family history of colorectal cancer to his knowledge.  His  uncle has cancer but he is not sure of the source.   SOCIAL HISTORY:  Single.  He has no children.  He is on disability.  He  continues to smoke, but has decreased from two and a half packs a day to  about a half to three fourths a pack a day.  He has had no alcohol  consumption in 14 months.  He never was a daily drinker.  Considered  himself a social  drinker previously.   REVIEW OF SYSTEMS:  See HPI for GI and CONSTITUTIONAL.  CARDIOPULMONARY:  He denies chest pain or shortness of breath.   PHYSICAL EXAMINATION:  VITAL SIGNS:  Weight 147, height 6 feet,  temperature 97.8 blood pressure 98/76, pulse 64.  GENERAL:  Pleasant and pale Caucasian male in no acute distress.  SKIN:  Warm and dry.  No jaundice.  HEENT:  Sclerae nonicteric.  Conjunctivae are pale.  Oropharyngeal  mucosa moist and pink.  Dentition in fair repair.  CHEST:  Lungs are clear to auscultation.  CARDIAC:  Exam reveals regular rate and rhythm.  No murmurs, rubs or  gallops.  His mediastinal incision is healed and there is abnormal  contouring of his incision.  ABDOMEN:  Positive bowel sounds flat, nontender, nondistended.  No  organomegaly or masses.  No rebound tenderness or guarding.  No  hepatosplenomegaly or masses.  No abdominal bruits or hernias.  EXTREMITIES:  No edema.   IMPRESSION:  Mr. Valade is a 55 year old gentleman with chronic anemia.  He has been on chronic iron therapy.  A this point in time, his ferritin  and iron is normal.  He has mild anemia with hemoglobin in the 10 range.  Although, looking back at his records, his hemoglobin had gotten up to  12 a couple of months ago.  He has had no evidence of overt GI bleeding.  He is not sure that he has had any Hemoccults done of the stools.  He  has never had a colonoscopy, therefore, recommend one at this time.  He  has a remote history of gastric ulcer, felt to be due to H.  Pylori  which was treated.   PLAN:  1. Colonoscopy with possible EGD.  His colonoscopy is negative.  2. Will hold his aspirin for 4 days prior to procedure and hold iron      for 7 days prior to procedure.  3. Colace 100 mg each evening, four boxes of samples provided.  I would like to thank Patrica Duel for allowing [place] to take part in the  care of this patient.      Tana Coast, P.AJonathon Bellows, M.D.   Electronically Signed    LL/MEDQ  D:  10/08/2006  T:  10/08/2006  Job:  [id number]   cc:   Patrica Duel, M.D.  Fax: [phone]

## 2010-06-24 NOTE — Discharge Summary (Signed)
NAMEMANNIE, OHLIN                 ACCOUNT NO.:  [id number]   MEDICAL RECORD NO.:  [id number]          PATIENT TYPE:  INP   LOCATION:  A222                          FACILITY:  [facility]   PHYSICIAN:  Lucita Ferrara, MD         DATE OF BIRTH:  [birth year]   DATE OF ADMISSION:  06/16/2007  DATE OF DISCHARGE:  05/10/2009LH                               DISCHARGE SUMMARY   DISCHARGE DIAGNOSES:  1. Syncopal episode.  2. Hypotension, resolved.  3. History of chronic alcohol abuse.  4. Protein-calorie malnutrition secondary to alcohol abuse likely.   CONSULTANTS:  1. Dr. Dani Gobble from cardiology.  2. Dr. Gerilyn Pilgrim from neurology.   PROCEDURES:  1. Chest x-ray Jun 16, 2007:  No active cardiopulmonary disease.  2. CT scan of the head Jun 16, 2007, showed negative CT scan.  3. Carotid Dopplers Jun 17, 2007, showed mild bilateral carotid bulb      plaque without evidence of hemodynamically significant stenosis.  4. The patient had an echocardiogram Jun 17, 2007, which showed overall      left ventricular systolic function to be normal, left ventricular      ejection fraction estimated at 60%.   BRIEF HISTORY OF PRESENT ILLNESS:  Mr. Hottel is a 54 year old Caucasian  male who presented to [facility] with fall x2, brought in  by EMS.  He stated that he could not remember falling and apparently  there was some loss of consciousness.  He was mildly hypotensive in the  emergency room.  At that time the patient admitted to actively drinking  alcohol.  He was admitted to a medical telemetry floor and syncopal  workup was initiated.  Consults were requested by Dr. Gerilyn Pilgrim and Dr.  Domingo Sep.  A full set of cardiac enzymes x3 were negative.  Given a  history of alcohol abuse, he had a vitamin B12, folate, anemia workup, 2-  D echocardiogram, carotid Dopplers, results above were sent and  negative.  CT scan of the head showed no acute processes.  Orthostatics  were within normal limits.   Consultation was requested from neurology  whose impression was that this is a syncopal episode of unclear  etiology.  Given hypotension, history of alcohol abuse this was thought  to be the cause.  He doubted any primary neurological reasons.  He did  note that there is clinical evidence of thiamine deficiency, although  his thiamine level was within normal limits.  He recommended continue to  treat for nutritional deficiency and vitamin B12.  Cardiology was also  consulted who on a note on Jun 17, 2007, noted that there was no obvious  cardiogenic reason for this syncopal episode.  Note that on the monitor  throughout the admission, the patient's rhythm was within normal limits.  In addition to all the above, PT/OT came to evaluate and their  assessment was that there was no skilled PT needed.  Upon mild hydration  and treatment with thiamine and folate, the patient's symptoms resolved.  He was able to tolerate ambulation without any dizziness or  syncopal  episode, falls, etc.   CLINICAL CONDITION UPON DISCHARGE:  Stable.   VITAL SIGNS:  Blood pressure is 134/76, temperature 97.8, pulse 79,  pulse oximetry 98% on room air.   DISCHARGE DIET:  Regular, heart healthy diet.   DISCHARGE DISPOSITION:  Home.   FOLLOW-UP:  He is advised to follow up with his primary care doctor for  scheduling an outpatient EEG as recommended by neurology.   In addition to above, the patient had mild protein-calorie malnutrition  and in this regard to dietary was consulted.  This was thought to be  secondary to his heavy alcohol use.   In regards to his alcoholism, the patient was put on Ativan protocol.  He did not show any signs of withdrawal.   ISSUES TO BE ADDRESSED AS AN OUTPATIENT:  1. The patient's chronic alcoholism and alcohol use needs to be      addressed in regards to attempts at rehab.  2. The patient also is recommended to get a follow-up for outpatient      EEG.  Appointments will  appropriately be made  3. In addition, I am going to get a home PT evaluation for home      safety.      Lucita Ferrara, MD  Electronically Signed     RR/MEDQ  D:  06/19/2007  T:  06/19/2007  Job:  [id number]

## 2010-06-24 NOTE — Procedures (Signed)
NAME:  [name] [name], [name] [name]                 ACCOUNT NO.:  [id number]   MEDICAL RECORD NO.:  [id number]          PATIENT TYPE:  OUT   LOCATION:  RESP                          FACILITY:  [facility]   PHYSICIAN:  Kofi A. Gerilyn Pilgrim, M.D. DATE OF BIRTH:  1955/10/23   DATE OF PROCEDURE:  DATE OF DISCHARGE:                              EEG INTERPRETATION   HISTORY:  This is a 55 year old man who presented with recurrent spells  of syncope and alteration of consciousness, suspicious for seizures.   MEDICATIONS:  Not listed, but reportedly takes blood pressure  medications and heart medications.   ANALYSIS:  A 16-channel recording is conducted for 21 minutes.  There is  a posterior rhythm of 9-9.5 Hz, which attenuates with eye opening. There  is beta activity observed in the frontal areas.  Significant portion of  the recording was observed during the sleep.  Stage II sleep with K  complexes and sleep spindles were observed.  Photic stimulation is  conducted without significant changes in the background activity.  There  is no focal or lateralized slowing. There is no epileptiform activity  observed.   IMPRESSION:  This is a normal recording of awake and sleep states.      Kofi A. Gerilyn Pilgrim, M.D.  Electronically Signed     KAD/MEDQ  D:  06/24/2007  T:  06/25/2007  Job:  [id number]   cc:   Patrica Duel, M.D.  Fax: [phone]

## 2010-06-24 NOTE — Assessment & Plan Note (Signed)
NAME:  JACOBS, GOLAB                  CHART#:  [id number]   DATE:  11/23/2006                       DOB:  02-12-1955   CHIEF COMPLAINT:  Follow up of procedures.   SUBJECTIVE:  Mr. Bulthuis is here for a followup. He has a history of  anemia. He underwent EGD and colonoscopy on October 19, 2006. He had a  normal colonoscopy including terminal ileum. He had ulcerative/erosive  reflux esophagitis, noncritical Schatzki's ring not manipulated, small  hiatal hernia, multiple antral erosions and superficial erosions.  Biopsies were benign. There was no Helicobacter pylori seen on Warthin-  Starry stain. The patient tells me that he does consume quite a bit of  [employer]. He may use 2 or 3 a day for headache usually several days  a week. He is also taking aspirin 325 mg daily. This history was not  given previously. He had a CBC last week which revealed a hemoglobin of  12.3, hematocrit 38.9, MCV 94.4, eosinophil count 11%. Hemoglobin is up  from 10.6 back in May of 2008. He is now on omeprazole 20 mg daily. He  has never had any reflux or abdominal pain. His stools are somewhat dark  on chronic iron therapy. He states his bowel movements are regular. He  has had no weight loss.   CURRENT MEDICATIONS:  See updated list.   ALLERGIES:  No known drug allergies.   PHYSICAL EXAMINATION:  Weight 149, temperature 97.9, blood pressure  100/70, pulse 64.  GENERAL:  Pleasant, thin, Caucasian male who appears somewhat older than  his stated age.  SKIN:  Warm and dry. No jaundice.  HEENT:  Sclerae nonicteric. Oropharyngeal mucosa moist and pink.  ABDOMEN:  Positive bowel sounds. Soft, nontender, nondistended. No  organomegaly or masses. No rebound tenderness or guarding. No abdominal  hernias.  EXTREMITIES:  No edema.   IMPRESSION:  History of normocytic anemia. His H&H has improved. He has  a history of gastric ulcer in 1997 and previously treated for  Helicobacter pylori. Recently had  ulcerative/erosive reflux esophagitis  as well as multiple antral erosions with superficial ulcerations.  Possibly due to aspirin use.   PLAN:  1. He will continue omeprazole 20 mg daily.  2. Refrain from [employer]. I have asked patient to follow up with      Dr. Nobie Putnam for workup      and treatment of headache.  3. CBC in 8 weeks.       Tana Coast, P.A.  Electronically Signed     R. Roetta Sessions, M.D.  Electronically Signed    LL/MEDQ  D:  11/23/2006  T:  11/24/2006  Job:  [id number]   cc:   Patrica Duel, M.D.

## 2010-06-24 NOTE — H&P (Signed)
NAME:  [name] [name], [name] [name]:  [id number]   MEDICAL RECORD NO.:  [id number]          PATIENT TYPE:  INP   LOCATION:  A222                          FACILITY:  [facility]   PHYSICIAN:  Dorris Singh, DO    DATE OF BIRTH:  11-30-1955   DATE OF ADMISSION:  06/16/2007  DATE OF DISCHARGE:  LH                              HISTORY & PHYSICAL   The patient is a 55 year old Caucasian male who presented to [facility]  emergency room with a history of two __________  falls.  Apparently, EMS  was called was to his house because the patient states that he had  fallen twice, one he fell on the stairs and then two he fell back about  a week ago.  He states he does  not remember falling.  There was some  loss of consciousness.  When he was brought to the emergency room, he  was found to be hypotensive and given about 500 mL of fluid in route.  The patient admits that he is currently drinking alcohol and also  complains of some chronic lower back pain.   His past medical history is significant for:  1. CABG.  2. Coronary artery disease.  3. GERD.  4. Hypertension.  5. Hyperlipidemia.  6. MRSA.  7. Myocardial infarction.  He had a CABG done where he got a staph      infection.   He is a current smoker in the last 12 months and heavy drinker.   ALLERGIES:  No known drug allergies.   CURRENT MEDICATIONS:  1. Metoprolol tartrate 50 mg twice a day.  2. Folic acid 1 mg once a day.  3. Amitriptyline 50 mg at bedtime.  4. Neurontin 300 mg 3 times.  5. Ferrous sulfate 225 once a day.  6. Megestrol 40 mg twice a day.  7. Simvastatin 20 mg once a day.  8. Advair Diskus 2 puffs b.i.d.  9. Nitroglycerin as needed.  10.Valium 10 mg as needed.  11.Sertraline unknown dose.  12.Omeprazole 20 mg p.o. daily.  13.Hydrocodone acetaminophen 5.   His review of systems:  Constitutionally, positive for weakness.  Negative for fever.  EYES:  Negative for blurred vision or changes or  eye pain.   EARS, NOSE, MOUTH AND THROAT:  Negative for changes in  hearing, smell or sore throat.  CARDIOVASCULAR:  Positive episodes of  chest pain over the last couple of weeks.  RESPIRATORY:  Positive  dyspnea.  GASTROINTESTINAL:  Negative abdominal pain, nausea, vomiting.  MUSCULOSKELETAL:  Negative for any arthralgias or arthritis.  SKIN:  Negative for any rashes.  NEUROLOGICAL:  Positive for weakness and  syncopal episodes and dizziness.   His vitals are as follows:  Blood pressure ranging from 81/52 to 106/71  with pulse rate of 71, respirations 18.  His pulse oximetry is 98% on 2  liters.  His physical exam:  Constitutionally, he is a well-developed, well-  nourished, Caucasian male who is appropriate, answering questions.  HEAD:  Is normocephalic, atraumatic.  EYES:  Are normal.  EOMI.  No  scleral icterus  or conjunctival injection.  EARS:  TMs visualized  bilaterally.  THROAT:  Is normal.  MOUTH AND PHARYNX:  No erythema or  exudate noted.  NECK:  Is supple.  No lymphadenopathy.  CARDIOVASCULAR:  Regular rate and rhythm.  CHEST:  Normal movement.  There is also a large indentation of surgical  repair.  RESPIRATORY:  Is clear to auscultation bilaterally.  No rales, wheezes  or rhonchi.  ABDOMEN:  Is soft, nontender, nondistended.  Bowel sounds in all four  quadrants.  EXTREMITIES:  Positive pulses.  No ecchymosis, edema or cyanosis.  NEUROLOGICAL:  Cranial nerves II-XII grossly intact.  Motor intact in  all extremities.  SKIN:  No rash noted.   His cardiac enzymes were normal.  His urine is negative.  CT of the head  is negative.  One view of the chest has no active disease.  Alcohol  level is 50.  CMET:  Sodium 134, potassium 3.5, chloride 107, carbon  dioxide 21, glucose 91, BUN 5, creatinine 1.04.  White count 6.3,  hemoglobin 11, hematocrit 31.1 and platelet count 157.   ASSESSMENT/PLAN:  1. Syncopal episode.  2. Hypotension.  3. History of ethanol abuse.  4. Chest pain.    PLAN:  Would be to admit the patient to service of [facility].  Will go  ahead and consult Dr. Gerilyn Pilgrim and Dr. Domingo Sep.  Will obtain a complete  set of cardiac enzymes and continue with blood work in the morning.  Will get a 2-D echo and a carotid Doppler.  Will reconcile his  medications.  Will do orthostatic blood pressures and neuro checks and  continue to monitor the patient and change therapy as necessary.  Discussed plan with the patient, and he states his understanding and  will continue to monitor him and make changes to management when  appropriate.      Dorris Singh, DO  Electronically Signed     CB/MEDQ  D:  06/16/2007  T:  06/16/2007  Job:  [id number]

## 2010-06-27 NOTE — Cardiovascular Report (Signed)
[name] [name], [name] [name]:  [id number]   MEDICAL RECORD NO.:  [id number]          PATIENT TYPE:  INP   LOCATION:  2312                         FACILITY:  [facility]   PHYSICIAN:  Darlin Priestly, MD  DATE OF BIRTH:  February 25, 1955   DATE OF PROCEDURE:  06/17/2005  DATE OF DISCHARGE:                              CARDIAC CATHETERIZATION   PROCEDURES:  1.  Coronary angiography.  2.  Obtuse marginal-1/proximal vein percutaneous transluminal coronary      angioplasty.  3.  Left anterior descending/proximal percutaneous transluminal coronary      angioplasty.  4.  Insertion of anterior balloon pump.   SURGEON:  Darlin Priestly, M.D.   INDICATIONS:  [name] [name] is a 56 year old male patient of Dr. Patrica Duel  with a history of ongoing tobacco use, positive family history of CAD and  hyperlipidemia who we initially saw in 2001, for chest pain with a normal  Cardiolite at that time.  Recently, his complaint of increasing substernal  chest pain radiating to his left arm associated with shortness of breath  which is nitroglycerin responsive.  He did undergo cardiac catheterization  as an outpatient on Jun 10, 2005, revealing calcification of the proximal LAD  with a 50% proximal LAD lesion and a 30% distal lesion.  There was a  dominant circumflex system with a 95% proximal OM-1 lesion as well as a 95%  OM-2 lesion.  He did have a small, nondominant right with an 80% lesion in  the right coronary with a normal EF.  He was brought today for percutaneous  intervention of the obtuse marginals.   DESCRIPTION OF PROCEDURE:  After receiving consent, the patient was brought  to the cardiac catheterization lab where right groin was shaved, prepped and  draped in usual sterile fashion.  Under modified Seldinger technique, a 6-  French arterial sheath was inserted into the right femoral artery.  Following this, a 6-French XB 3.5 guiding catheter with side holes selected  and  selective angiography was performed.  This confirmed 99% stenosis in  both OM-1 and OM-2 branches.  There was noted to be 50-60% proximal LAD  lesion which was unchanged.  Following this, a 0.014 ATW marker wire was  inserted into the catheter and positioned to the distal OM-1 without  difficulty.  We then attempted to pass a Voyager 2.0 x 15 mm balloon across  the proximal lesion, however, we were unsuccessful and this balloon has been  removed.  Following this, a Voyager 1.5 x 12 mm balloon was then positioned  across the proximal stenotic lesion and two inflations to 14 atmospheres  were performed for a total of 58 seconds.  This revealed no evidence of  dissection or thrombus with TIMI III flow in distal vessel.  A Voyager 2.0 x  15 mm balloon was positioned across the proximal stenotic lesion and one  inflation to 10 atmospheres was performed for a total of 1 minute.  Followup  angiogram revealed good luminal gain.  A second inflation to 14 atmospheres  was performed for approximately 15 seconds when  it was immediately noted  that there was staining in the proximal portion of the LAD.  This balloon  was then removed and followup angiogram revealed obvious dissection in the  proximal LAD extending proximally one-third of the way down the vessel with  TIMI I to TIMI II flow.  This balloon was then removed and we immediately  placed a Forte guide wire into the LAD which was positioned in the apex.  We  then took a 3.0 x 20 mm Maverick balloon into the proximal LAD with four  subsequent inflations to a maximum of 8 atmospheres for a total of  approximately 1 minute and 15 seconds.  Followup angiogram revealed  persistent I to II flow in the LAD.  We did give multiple doses of  intracoronary nitroglycerin as well as adenosine, however, we were unable to  establish TIMI III flow.  Dr. Cornelius Moras was immediately notified and did come to  the catheterization lab.  The films were reviewed and it was  agreed that  dissection appeared to start at the ostium in the LAD and extended down 30-  35 mm in the LAD.  We did place an interior balloon pump in the left femoral  artery without difficulty.  We did remove the OM guide wire, however, we  elected to leave the LAD guide wire in place to help facilitate flow in the  LAD.  We did speak at length to [name] [name] regarding the need for urgent  surgery and he was willing to proceed.  He remained hemodynamically stable  throughout.  The LAD guide wire was left in place and the right femoral  sheath and guide were then sutured into place.  The balloon pump was then  sutured into place.  Angiomax was discontinued prior to transfer of patient  to the OR.  Final angiograms reveal TIMI I to TIMI II flow in the LAD with  proximal LAD dissection.  He has 50-60% residual lesion in obtuse marginal-  1.   CONCLUSION:  1.  Successful percutaneous transluminal coronary angioplasty of the obtuse      marginal-1 with spontaneous dissection of the proximal left anterior      descending and subsequent percutaneous transluminal coronary angioplasty      of the left anterior descending.  2.  Insertion of interior aortic balloon pump.  3.  Adjuvant use of Angiomax infusion.  4.  Urgent transfer to the operating room for left internal mammary artery      to the left anterior descending, vein graft to obtuse marginal-1 and      obtuse marginal-2.      Darlin Priestly, MD  Electronically Signed     RHM/MEDQ  D:  06/17/2005  T:  06/18/2005  Job:  [id number]   cc:   Patrica Duel, M.D.  Fax: [phone]

## 2010-06-27 NOTE — Op Note (Signed)
NAMELINN, GOETZE NO.:  [id number]   MEDICAL RECORD NO.:  [id number]          PATIENT TYPE:  INP   LOCATION:  3311                         FACILITY:  [facility]   PHYSICIAN:  Salvatore Decent. Cornelius Moras, M.D. DATE OF BIRTH:  1955/04/13   DATE OF PROCEDURE:  07/20/2005  DATE OF DISCHARGE:                                 OPERATIVE REPORT   PREOPERATIVE DIAGNOSIS:  Sternal wound infection.   POSTOPERATIVE DIAGNOSIS:  Sternal wound infection.   PROCEDURE:  Excisional sternal wound debridement with placement of vacuum-  assisted closure device.   SURGEON:  Dr. Purcell Nails.   ANESTHESIA:  General.   BRIEF CLINICAL NOTE:  The patient is a 55 year old male who underwent  emergency coronary artery bypass grafting previously and subsequently  developed a superficial sternal wound infection that penetrated deep to the  bone, requiring sternal wound debridement.  The patient is now brought back  to the operating room for further wound debridement and possible flap  closure.  The patient has been counseled at length regarding the  indications, risks, and potential benefits of surgery.  Alternative  treatment strategies have been discussed.  He understands and accepts all  associated risks and desires to proceed as described.   OPERATIVE NOTE IN DETAIL:  The patient is brought to the operating room on  the above-mentioned date and placed in the supine position on the operating  table.  A radial arterial line and central venous catheter are placed by the  anesthesia service under the care and direction of Dr. Kipp Brood.  General endotracheal anesthesia is induced uneventfully.  The patient's  existing wound dressing is removed.  The patient's wound and anterior chest  are prepared and draped in sterile manner with Betadine solution.  The wound  is carefully examined.  The wound is remarkably clean with healthy appearing  granulation tissue on both sides.  Most notably, there  has been remarkably  little separation of the sternum inferiorly in particular, despite removal  of all the previous sternal wires.  The patient is very thin and as such the  wound is quite shallow.  The wound is debrided.  Any devitalized tissue is  excised sharply, although there is a very minimal amount of devitalized  tissue.  Rongeurs are utilized to debride the anterior table of the sternum.  After careful examination of the wound, it appears that delayed secondary  closure may in fact be the most expeditious means of complete healing and  that flap coverage of exposed bone is not necessary due to the minimal  amount of exposed bone and the minimal amount of separation of the sternum.  Additional debridement is performed inferiorly and in particular the  confluence of the costal cartilages on the left side are debrided.  A minor  venous bleeder in this region is controlled with single Prolene suture.  The  wound is irrigated using 3 liters of saline solution with a pulse irrigation  device.  A wound vacuum-assist closure device is now placed after trimming  the sponge to appropriate size and dimension, the  VAC is connected to 125 mm  continuous suction.  The patient  tolerated the procedure well, is extubated in the operating room, and  transported to the recovery room in stable condition.  There are no  intraoperative complications.  All sponge, instrument and needle counts are  verified correct.  Estimated blood loss for the procedure was 300 mL.      Salvatore Decent. Cornelius Moras, M.D.  Electronically Signed     CHO/MEDQ  D:  07/20/2005  T:  07/20/2005  Job:  [id number]

## 2010-06-27 NOTE — Op Note (Signed)
NAMEVIDIT, BOISSONNEAULT                 ACCOUNT NO.:  [id number]   MEDICAL RECORD NO.:  [id number]          PATIENT TYPE:  INP   LOCATION:  3314                         FACILITY:  [facility]   PHYSICIAN:  Salvatore Decent. Cornelius Moras, M.D. DATE OF BIRTH:  18-May-1955   DATE OF PROCEDURE:  11/06/2005  DATE OF DISCHARGE:                                 OPERATIVE REPORT   PREOPERATIVE DIAGNOSIS:  Sternal wound infection.   POSTOPERATIVE DIAGNOSIS:  Sternal wound infection.   PROCEDURE:  Sternal wound debridement.   SURGEON:  Dr. Purcell Nails.   ANESTHESIA:  General.   BRIEF CLINICAL NOTE:  The patient is a 55 year old male who underwent  emergency coronary artery bypass grafting in May 2007.  The patient  developed a sternal wound infection requiring sternal wound debridement and  removal of all of the existing sternal wires.  Since then, the patient has  been treated with a prolonged course of IV antibiotics as well as delayed  secondary closure of wound by secondary intent using a vacuum-assisted  closure device.  The patient had done well for quite some time, but now he  returns with an area of purulent drainage in the upper middle portion of the  sternal wound associated with malaise and increased pain, but without  associated fevers, elevated white blood count or other signs of systemic  infection.  Chest CT scan reveals no sign of mediastinitis and specifically  there is no sign of fluid collection or abscess beneath the sternum.   OPERATIVE CONSENT:  The patient and his sister have been counseled regarding  the indications and potential benefits of open wound debridement to  facilitate subsequent wound drainage and care.  They accept all associated  risks of surgery and desire to proceed as described.  They also understand  that because of the patient's baseline anemia, he may require blood  transfusion.  All their questions have been addressed.   OPERATIVE NOTE IN DETAIL:  The patient is  brought to the operating room on  the above-mentioned date and placed in the supine position on the operating  table.  General endotracheal anesthesia is induced uneventfully under the  care and direction of Dr. Hart Robinsons.  The patient's anterior chest  is prepared and draped in sterile manner.  Intravenous antibiotics are  administered.  The patient's existing wound is carefully examined.  The  purulent drainage and area of surrounding cellulitis is located along the  left upper portion of the body of the sternum, approximately one third away  from the very top of the incision to the very inferior portion of the  incision.  The lower one third of the surgical incision appears to have  healed completely.  The open draining area is opened further using a 10  blade knife and a curette is utilized to evacuate all associated devitalized  tissue.  The bone remains unstable as has been expected, and there appears  to be one area with some necrotic bone in particular that likely represents  the nidus of infection in question.  All of this is debrided  using  excisional debridement and the wound is irrigated with a pulse  lavage irrigation device.  Hemostasis is ascertained.  The wound is packed  with saline moistened gauze and dry sterile dressing is applied.  The  patient tolerated the procedure well, is extubated in the operating room,  and transported to the recovery room in stable condition.  There are no  intraoperative complications.      Salvatore Decent. Cornelius Moras, M.D.  Electronically Signed     CHO/MEDQ  D:  11/06/2005  T:  11/08/2005  Job:  [id number]

## 2010-06-27 NOTE — Op Note (Signed)
[place]  Patient:    [name] [name], [name] [name]                        MRN: [id number] Proc. Date: 05/04/00 Adm. Date:  [id number] Attending:  Danella Penton                           Operative Report  PREOPERATIVE DIAGNOSIS:  L4-5 herniated disk with a bilateral L5 radiculopathy.  POSTOPERATIVE DIAGNOSIS:  L4-5 herniated disk with a bilateral L5 radiculopathy.  PROCEDURES:  Bilateral L4-5 diskectomy, foraminotomy.  Microscope.  SURGEON:  Tanya Nones. Jeral Fruit, M.D.  ASSISTANT:  Stefani Dama, M.D.  CLINICAL HISTORY:  Mr. Limones is a gentleman who in the past underwent L4-5 diskectomy and L5-S1 foraminotomy.  The patient did well, but now he had been complaining of pain going to the right leg.  I have followed him in my office on several occasions, and sometimes he has pain in the left leg and sometimes the right leg.  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  Nevertheless, prior to putting him to sleep, he was telling me that mostly he is having pain in his right leg but also in the left leg.  Because the pathology is at both levels, we decided to go ahead with bilateral exploration of 4-5.  The patient knew about the risks, such as CSF leak, which he did before, complications because of his COPD secondary to his chronic smoking, infection, and need for further surgery which might require fusion.  DESCRIPTION OF PROCEDURE:  The patient was taken to the OR and positioned in a prone manner.  The previous scar in the skin was removed.  The muscle and fascia were retracted laterally.  We started in the left side, where we identified 4-5 by x-ray.  With the drill, we did the laminotomy of L4, L5, and we removed the yellow ligament.  Indeed, we found a bulging disk really affecting the takeoff of L5.  There was quite  a bit of spondylosis with foraminal narrowing.  Foraminotomy was accomplished.  We did a lysis of adhesions.  We retracted the L5 nerve root, and we entered the disk space.  We removed quite a bit of degenerative disk.  Then in the left side where he had previous surgery, we did a lysis of adhesions.  With the drill, also we drilled part of the medial facet.  The canal was more narrow than the one on the right side.  Foraminotomy was accomplished.  We retracted medially, and indeed there was a recurrent disk.  Incision was done, and then we did a total diskectomy.  At the end, we had total bilateral diskectomy.  Then a Valsalva maneuver was negative.  We investigated the L4 nerve root.  It was open.  From then on, the area was irrigated.  Fentanyl and Depo-Medrol were left in the epidural space, and the wound was closed with Vicryl and nylon. DD:  05/04/00 TD:  05/04/00 Job: [id number]

## 2010-06-27 NOTE — Procedures (Signed)
[name], [name] [name]:  [id number]   MEDICAL RECORD NO.:  [id number]          PATIENT TYPE:  EMS   LOCATION:  ED                            FACILITY:  [facility]   PHYSICIAN:  Edward L. Juanetta Gosling, M.D.DATE OF BIRTH:  1955/05/04   DATE OF PROCEDURE:  07/14/2005  DATE OF DISCHARGE:                                EKG INTERPRETATION   The rhythm is sinus rhythm with a rate in the 80s.  There is right atrial  enlargement and possibly left atrial enlargement.  There is an incomplete  right bundle branch block.  __________is generally high.  Slow R wave  progression across the precordium may indicate a previous anterior  infarction but may be related to the incomplete right bundle branch block as  well.  Abnormal electrocardiogram.      Oneal Deputy. Juanetta Gosling, M.D.  Electronically Signed     ELH/MEDQ  D:  07/15/2005  T:  07/16/2005  Job:  [id number]

## 2010-06-27 NOTE — Op Note (Signed)
NAMEMOIZ, RYANT NO.:  [id number]   MEDICAL RECORD NO.:  [id number]          PATIENT TYPE:  INP   LOCATION:  2312                         FACILITY:  [facility]   PHYSICIAN:  Salvatore Decent. Cornelius Moras, M.D. DATE OF BIRTH:  1955-11-15   DATE OF PROCEDURE:  06/17/2005  DATE OF DISCHARGE:                                 OPERATIVE REPORT   PREOPERATIVE DIAGNOSIS:  Bleeding, status post coronary artery bypass  grafting.   POSTOPERATIVE DIAGNOSIS:  Bleeding, status post coronary artery bypass  grafting.   PROCEDURE:  Mediastinal re-exploration for bleeding.   SURGEON:  Salvatore Decent. Cornelius Moras, MD   ANESTHESIA:  General.   BRIEF CLINICAL NOTE:  The patient is a 55 year old male who underwent  emergency coronary artery bypass grafting earlier in the day on Jun 17, 2005  for acute angioplasty failure.  The patient had been treated with aggressive  Plavix therapy prior to attempts at elective percutaneous coronary  intervention and also received anticoagulation while he was in the cath lab.  He was noted to have severe coagulopathy at the time of his initial surgery.  After return to the surgical intensive care unit, the patient was initially  hypothermic and severely coagulopathic.  He was aggressively resuscitated  with blood products and his body temperature was warmed.  The chest tube  output persisted at unacceptably high levels and the patient is now brought  back to the operating room for re-exploration.  The patient's family was  appraised of the developments and gives consent for the procedure as  described.   OPERATIVE NOTE IN DETAIL:  The patient is brought directly from the surgical  intensive care unit to the operating room on the evening of Jun 17, 2005.  The patient is placed in the supine position on the operative table.  Adequate general anesthesia is verified under the care and direction of Dr.  Bedelia Person.  The patient's existing chest tubes are removed and the  anterior  chest, abdomen and both groins are prepared and draped in a sterile manner.  A warm air conduction device is placed over the lower body to maintain body  temperature throughout.  The patient's existing sternal incision is  reopened.  The sternal wires are removed.  The mediastinum is re-explored.  There is diffuse coagulopathy.  There is no sign of any significant  mechanical bleeding.  An exhaustive search is continued and simultaneously,  aggressive blood product administration is continued by the Anesthesia  Service to correct the patient's ongoing coagulopathy.  This is continued  and the mediastinum and pleural spaces are irrigated with warm saline  solution.  Gradually, with aggressive the placement of blood products, the  patient's hemostasis seems to gradually improve.  There are no significant  sites of surgical bleeding identified and ongoing bleeding appears to be  from cut skin edges and bone edges in particular, as well as the soft  tissues of the mediastinum.  All of the patient's bypass graft and the heart  itself are carefully examined and there is no sign of bleeding from any the  surgical sites.  Once hemostasis appears to be satisfactory, the mediastinum  and left pleural spaces are drained using 4 chest tubes exited through  separate stab incisions inferiorly.  The soft tissues anterior to the aorta  are reapproximated loosely.  The sternum is closed with double-strength  sternal wire.  The soft tissues anterior to the sternum are closed in  multiple layers and the skin is closed with a subcuticular skin closure.  The patient tolerated the procedure well and  was transported back to the surgical intensive care unit in stable  condition.  He remained hemodynamically stable throughout.  He received a  total of 6 units of packed red blood cells, 6 units of fresh frozen plasma  and 2 units of pooled adult platelets in addition to that which was given  prior to  return to the operating room.      Salvatore Decent. Cornelius Moras, M.D.  Electronically Signed     CHO/MEDQ  D:  06/17/2005  T:  06/19/2005  Job:  [id number]

## 2010-06-27 NOTE — Consult Note (Signed)
NAMEJAKAREE, PICKARD NO.:  [id number]   MEDICAL RECORD NO.:  [id number]          PATIENT TYPE:  INP   LOCATION:  2312                         FACILITY:  [facility]   PHYSICIAN:  Judie Petit, M.D. DATE OF BIRTH:  [birth year]   DATE OF CONSULTATION:  06/17/2005  DATE OF DISCHARGE:                                   CONSULTATION   Mr. Schnepf is a 55 year old male who presents today for emergent coronary  artery bypass grafting by Dr. Tressie Stalker.  The patient was brought to the  operating room where pulmonary artery and arterial lines were placed with  sedation under local anesthesia.  The patient was brought to the operating  room on a balloon pump.  After general anesthesia was performed, the  transesophageal echocardiogram probe was heavily lubricated in a sleeve and  placed down to oropharynx without difficulty.   PREBYPASS CORONARY ARTERY BYPASS GRAFTING:  Left ventricle:  Overall left  ventricular contraction was normal.  There were no masses noted within the  left ventricular chamber.  Papillary muscles were well outlined.   Aortic valve:  Aortic valve was thought trileaflet in nature.  There was no  significant aortic regurgitant flow.   Mitral valve:  Mitral valve appeared to function appropriately.  There was  trace to 1+ mitral regurgitant flow on Doppler examination.   Right ventricle: Overall right ventricle looked within normal limits.  Tricuspid valve overall appeared to function appropriately.   Left atrium:  Interatrial septum was intact.  Left atrial appendage was  visualized.  There were no masses noted within the left appendage.   POST CORONARY ARTERY BYPASS GRAFTING:  Left ventricle:  Overall left  ventricular contraction appeared normal.  There was decreased volume noted.  However, with replacement of volume, the contractile pattern improved.  The  rest of the cardiac exam was as previously described.  The transesophageal  echocardiogram  probe was removed after the end of the surgery without  difficulty.  The patient was subsequently taken to the surgical intensive  care unit in stable condition.           ______________________________  Judie Petit, M.D.     CE/MEDQ  D:  06/17/2005  T:  06/18/2005  Job:  [id number]

## 2010-06-27 NOTE — Discharge Summary (Signed)
[name] [name], [name] [name]:  [id number]   MEDICAL RECORD NO.:  [id number]          PATIENT TYPE:  INP   LOCATION:  2036                         FACILITY:  [facility]   PHYSICIAN:  Salvatore Decent. Cornelius Moras, M.D. DATE OF BIRTH:  [birth year]   DATE OF ADMISSION:  07/14/2005  DATE OF DISCHARGE:                                 DISCHARGE SUMMARY   PRIMARY DIAGNOSES:  1.  Sternal wound infection positive for staph aureus.  2.  Bacteremia.   IN-HOSPITAL DIAGNOSES:  Acute blood loss anemia.   SECONDARY DIAGNOSES:  1.  Coronary artery disease status post coronary artery bypass grafting x3      done emergently on May9,2007.  2.  Chronic obstructive pulmonary disease.  3.  Hypertension.  4.  Dyslipidemia.  5.  Chronic back pain.  6.  History of L4-L5 diskectomy.   OPERATIONS AND PROCEDURES:  1.  Sternal wound debridement.  2.  Excisional sternal wound debridement with placement of vac,  closure      device.   HISTORY AND PHYSICAL/HOSPITAL COURSE:  [name] [name] is a 55 year old gentleman  who underwent emergency coronary artery bypass grafting x3 for a angioplasty  failure in [id number] by Dr. Cornelius Moras.  The patient's original surgical procedure  was notable for severe coagulopathy related to preoperative treatment with  anticoagulation agents including Plavix.  The patient ultimately recovered  uneventfully was discharged home.  The patient was readmitted to the  hospital on June5 with fevers, chills, increased chest wall pain and  purulent drainage from the superior aspect of sternal wound.  At that time,  Dr. Cornelius Moras was consulted.  Dr. Cornelius Moras saw and evaluated the patient.  The wound  was opened at the bedside demonstrating what appeared to be a small suture  wire abscess immediately over the manubrium.  Dr. Cornelius Moras discussed with the  patient going to the operating room for sternal wound debridement and  evaluation.  He discussed risks and benefits.  The patient shows his  understanding and  agreed to proceed.     The patient was taken to operating room June6,2007 where he underwent  sternal wound debridement.  The patient tolerated this procedure well and  was transferred up to the intensive care unit in stable condition.  He was  started on dressing changes at that time.  The patient was continued on  vancomycin.  Stool cultures came back positive for staph aureus.  Infectious  disease had been following the patient since his admission.  They  recommended the patient to continue vancomycin for a long-term course, 6-8  weeks.  Dressing changes discontinued and the patient's sternal wound  continued to heal.  Dr. Cornelius Moras to take him back to the operating room July 20, 2005 for excisional sternal wound debridement and placement of vacuum-  assisted closure sites.  The patient tolerated this procedure well and was  transferred back up to the intensive care unit in stable condition.  Over  the next several days, the patient had the wound vac changed on Monday,  Wednesday, Friday.  After a change, the patient's sternal  wound continued to  show progression.  He had good granulation tissue noted.  No purulent  drainage.  The patient was afebrile during the remainder of his  postoperative course.  Vital signs were monitored and seen to be stable.  He  was afebrile.  The patient remained in normal sinus rhythm.  Respiratory was  clear to auscultation bilaterally.  The patient was out of bed and moving  around slightly.  He was tolerating a regular diet well.  No nausea,  vomiting noted.  He was off oxygen, sating greater than 92% percent.  During  the patient's hospital course, care manager was consulted for DC planning.  It is felt that the patient would benefit from skilled nursing facility  placement, short-term, for receiving IV antibiotics and wound care  management.  Care manager discussed with the patient multiple options.  Currently awaiting nursing placement.   The patient  is ready for discharge home as soon as a nursing home bed is  available.  Follow-up appointment will be made with Dr. Cornelius Moras for 1 week.  Office will contact the patient with this appointment.  The patient is not  to drive until _____ to do so, no heavy lifting over 10 pounds.  Wound vac  to be changed Monday, Wednesday, Friday.  Please contact the office if he  develops any fever , purulent drainage or cellulitis from sternal wound.  He  is to ambulate 3-4 times per day, progress as tolerated and to continue  breathing exercises.   DIET:  To be low-fat, low-salt.   DISCHARGE MEDICATIONS:  1.  Altace 2.5 mg daily.  2.  Elavil 50 mg at night.  3.  Neurontin 300 mg t.i.d..  4.  Niferex 150 mg daily.  5.  Folic acid 1 mg daily.  6.  Zocor 20 mg at night.  7.  Aspirin 81 mg daily.  8.  Multivitamin daily.  9.  Lopressor 25 mg b.i.d.  10. Vancomycin IV per pharmacy.  11. Darvocet-N 100 1-2 tablets q.4-6 h.  p.r.n. pain.  1.  Tylenol 650 1-2 tablets q. 4-6 hours p.r.n. pain.      Theda Belfast, PA      Salvatore Decent. Cornelius Moras, M.D.  Electronically Signed    KMD/MEDQ  D:  07/27/2005  T:  07/28/2005  Job:  [id number]   cc:   Salvatore Decent. Cornelius Moras, M.D.  [address]  [place]  [place] [address]

## 2010-06-27 NOTE — Procedures (Signed)
NAMECRYSTIAN, [name] [name]                 ACCOUNT NO.:  [id number]   MEDICAL RECORD NO.:  [id number]          PATIENT TYPE:  OUT   LOCATION:  RAD                           FACILITY:  [facility]   PHYSICIAN:  Edward L. Juanetta Gosling, M.D.DATE OF BIRTH:  1955/11/19   DATE OF PROCEDURE:  04/28/2005  DATE OF DISCHARGE:                              PULMONARY FUNCTION TEST   RESULTS:  1.  Spirometry shows a mild ventilatory defect with airflow obstruction.      The airflow obstruction is most marked in the area of the smaller      airways.  2.  Lung volumes are normal.  3.  DLCO is normal.  4.  Arterial blood gases are normal.  5.  There is no significant bronchodilator effect.      Edward L. Juanetta Gosling, M.D.  Electronically Signed     ELH/MEDQ  D:  04/29/2005  T:  04/30/2005  Job:  [id number]

## 2010-06-27 NOTE — Op Note (Signed)
[name] [name], ALMS NO.:  [id number]   MEDICAL RECORD NO.:  [id number]          PATIENT TYPE:  INP   LOCATION:  2314                         FACILITY:  [facility]   PHYSICIAN:  Salvatore Decent. Cornelius Moras, M.D. DATE OF BIRTH:  [birth year]   DATE OF PROCEDURE:  07/15/2005  DATE OF DISCHARGE:                                 OPERATIVE REPORT   PREOPERATIVE DIAGNOSIS:  Sternal wound infection.   POSTOPERATIVE DIAGNOSIS:  Sternal wound infection.   PROCEDURE:  Sternal wound debridement.   SURGEON:  Dr. Purcell Nails.   ANESTHESIA:  General.   BRIEF CLINICAL NOTE:  The patient is a 54 year old male who underwent  emergency coronary artery bypass grafting x3 for angioplasty failure in May  2007.  The patient's original surgical procedure was notable for severe  coagulopathy related to preoperative treatment with anticoagulation agents  including Plavix.  The patient ultimately recovered uneventfully and was  discharged home.  The patient was readmitted to the hospital on June 5 with  fevers, chills, increased chest wall pain and purulent drainage from the  superior aspect of the sternal wound.  The wound was opened at the bedside  demonstrating what appeared to be a small stitch or wire abscess over the  manubrium.  The patient defervesced and improved, clinically.  The patient  was brought to the operating room for more definitive surgical debridement.  The patient and his family have been counseled at length regarding the  indications, risks, and potential benefits of surgery.  Alternative  treatment strategies been discussed.  They understand and accept all  associated risks and desire to proceed as described.   OPERATIVE NOTE IN DETAIL:  The patient brought to the operating room on the  above-mentioned date and placed in the supine position on the operating  table.  General endotracheal anesthesia is induced uneventfully under the  care and direction of Dr. Diamantina Monks.   The patient's existing sternal wound  bandage is removed.  The patient's anterior chest was prepared, draped in  sterile manner.  The wound was carefully explored.  A small open wound  overlying the manubrium tracked down directly to the manubrium and the  sternal wires.  There is some purulent material that can be expressed on  palpation of the sternum.  The wound was opened somewhat more superiorly and  this tracked to a small subcutaneous cavity overlying the sternal notch.  Again purulent material was evacuated.  The three sternal wires in the  manubrium are removed.  Inferiorly, some pus does seem to track along the  sternal wires.  The wound is unroofed as long as the purulent drainage is  identified and this was continued all way to the bottom the sternum.  All  the sternal wires were removed.  The wound is irrigated using 3 liters of  saline solution with a pulse irrigation lavage device.  Excisional  debridement of a small amount of necrotic debris superiorly is performed  although minimal debridement is necessary.  The wound is now irrigated with  vancomycin and subsequently packed  using sterile  Kerlix gauze soaked in vancomycin.  Dry sterile dressing was  applied.  The patient tolerated the procedure well, was extubated in the  operating room, transferred to recovery room in stable condition.  There are  no intraoperative complications.  Estimated blood loss was less than 50 mL.      Salvatore Decent. Cornelius Moras, M.D.  Electronically Signed     CHO/MEDQ  D:  07/15/2005  T:  07/15/2005  Job:  [id number]

## 2010-06-27 NOTE — H&P (Signed)
[place]. [facility]  Patient:    [name] [name], [name] [name] Visit Number: [id number] MRN: [id number]          Service Type: SUR Location: [facility] 3172 07 Attending Physician:  Danella Penton Dictated by:   Tanya Nones. Jeral Fruit, M.D. Admit Date:  03/18/2001                           History and Physical  HISTORY OF PRESENT ILLNESS:  Mr. Venard is a gentleman who underwent in past, twice, L4-L5 diskectomies.  The patient did well, lately had been complaining of worsening of the pain with radiation down to the right leg, but also in the left leg.  He has quite a bit of weakness of the right foot up to the point that the dorsiflexors are now 1-2/5.  He had an MRI which showed recurrent disk at the level of 4-5 with a borderline stenosis of the level of 3-4 with L5-S1 normal.  Because this is the third time this gentleman has had recurrence, he is going to be admitted for back fusion.  PAST MEDICAL HISTORY:  L4-5 diskectomies twice.  SOCIAL HISTORY:  Patient drinks socially.  He is a heavy smoker, he smokes almost between two and a half to three packs a day.  FAMILY HISTORY:  Negative.  REVIEW OF SYSTEMS:  Positive for high cholesterol and emphysema.  PHYSICAL EXAMINATION:  GENERAL:  This is a patient who came with his wife and indeed, he was quite miserable.  He was limping from his right leg.  He was unable to sit.  HEENT:  Normal.  NECK:  Normal.  LUNGS:  There are some rhonchi bilaterally with bilateral wheezing.  HEART:  Heart sounds normal.  ABDOMEN:  Normal.  EXTREMITIES:  Normal pulses.  NEUROLOGIC:  Mental status normal.  Cranial nerves normal.  Strength:  He had weakness that involved the dorsiflexion of the right foot like 2/5 with a weakness in the quadriceps.  In the left side, he has like 4/5 dorsiflexion. Reflexes are symmetrical, 1+.  Sensation:  He complained of numbness in the tops of both feet.  BACK:  In the lumbar area, he has a  midline scarred incision from previous surgery.  LABORATORY AND ACCESSORY DATA:  The MRI showed that he has degenerative disk disease at the level of 4-5 with a large herniated disk bilaterally with a borderline stenosis at the level of 3-4.  There is no subluxation when doing flexion and extension.  CLINICAL IMPRESSION: 1. 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 2. Status post two lumbar diskectomies.  RECOMMENDATIONS:  The patient is being admitted for L4-L5 diskectomy, interbody fusion, posterolateral fusion and pedicle screw.  He knows about the risks such as infection, CSF leak, worsening of pain and paralysis, need for further surgery, failure of the graft and the screws secondary to the history of smoking. Dictated by:   Tanya Nones. Jeral Fruit, M.D. Attending Physician:  Danella Penton DD:  03/18/01 TD:  03/18/01 Job: [id number]

## 2010-06-27 NOTE — Op Note (Signed)
[place]. [facility]  Patient:    [name] [name], [name] [name]                        MRN: [id number] Proc. Date: 04/22/99 Adm. Date:  [id number] Attending:  Danella Penton                           Operative Report  PREOPERATIVE DIAGNOSIS:  Cerebrospinal fluid leak.  POSTOPERATIVE DIAGNOSIS:  Cerebrospinal fluid leak.  OPERATION PERFORMED:  Exploration of lumbar wound.  Repair of a small cerebrospinal fluid leak.  Microscope.  SURGEON:  Tanya Nones. Jeral Fruit, M.D.  ANESTHESIA:  INDICATIONS FOR PROCEDURE:  [name] is a gentleman who two months ago underwent a left L4-5 diskectomy.  The patient did well but later on he had some small accumulation of fluid.  I have been following him in my office for the past two  months and that fluid, although is not giving him headaches, nevertheless is getting bigger.  There was no evidence of any fluid going through the skin. Because of ____________ one we decided to go ahead and take him today to surgery to explore the wound.  The patient knew of the risks, infection, with heavy history of smoking, no healing whatsoever of the repair and need for further surgery.  DESCRIPTION OF PROCEDURE:  The patient was taken to the operating room and positioned in a prone manner.  The back was prepped with Betadine.  Drapes were  applied.  Incision removing the previous scar was done.  Immediately we found some CSF clear in the ____________ space.  We explored the area of 4-5 where he had he disk before.  Indeed, the L5 nerve root as well as the L4 were clean.  The dura  mater ventrally and laterally was normal and there was a tiny amount of opening  dorsolaterally.  Valsalva maneuver was done and indeed this was the area for the CSF leak.  The tissue was friable and it was difficult to get some good edges.  Nevertheless using Nurulon, we were able to bring the edges together.  A piece f fat was left on top of that.  Having  done this, Valsalva maneuver done three times was negative.  Then Tisseal was put in the epidural space to seal up the area. On top of that we left blood, plus more muscle.  Then the wound was irrigated, closed with Vicryl and nylon to the skin.  The patient did well. DD:  04/22/99 TD:  04/22/99 Job: [id number]

## 2010-06-27 NOTE — Discharge Summary (Signed)
[name] [name], CULP NO.:  [id number]   MEDICAL RECORD NO.:  [id number]          PATIENT TYPE:  INP   LOCATION:  2033                         FACILITY:  [facility]   PHYSICIAN:  Salvatore Decent. Cornelius Moras, M.D. DATE OF BIRTH:  11-Feb-1955   DATE OF ADMISSION:  11/05/2005  DATE OF DISCHARGE:  11/13/2005                                 DISCHARGE SUMMARY   HISTORY OF PRESENT ILLNESS:  The patient is a 55 year old white male who is  status post emergency coronary artery bypass grafting in May of 2007 with  subsequent readmission for an MRSA sternal wound infection in June of 2007.  At that time he had underwent treatment including incision and drainage with  Vac placement and intravenous antibiotics and was discharged to a short term  stay in a nursing facility for vancomycin and Vac dressing changes.  He was  last seen by Dr. Cornelius Moras on 10/26/05 at the office with the wound felt to be  doing very well and the Vac was discontinued.  On the date of admission,  11/05/05, he was seen in the CV[facility] by Dr. Tyrone Sage and was felt to require  admission for further wound care and intravenous antibiotics as the wound  had increasing pain with purulent drainage.  Additionally, he had some  chills but no specific fevers measured.  He had some additional shortness of  breath, specifically dyspnea on exertion with orthopnea.  He had no cough or  sputum production.  He did have nocturia and occasional diaphoresis.  He has  some occasional reflux symptoms as well.  He was admitted to the hospital as  stated for further evaluation and statement.   PAST MEDICAL HISTORY:  1. Coronary artery disease.  2. Chronic obstructive pulmonary disease.  3. Hypertension.  4. Hyperlipidemia.  5. History of sternal wound infection with MRSA staph.  6. History of bacteremia associated with the sternal wound infection and      chronic back pain.   PAST SURGICAL HISTORY:  1. Includes previous L4-5 diskectomy.  2.  History of emergency coronary bypass grafting in May of 2007.  3. History of sternal wound debridement and Vac placement June of 2007.   ALLERGIES:  No known drug allergies.   MEDICATIONS PRIOR TO ADMISSION:  Folic acid 1 mg daily, Elavil 50 mg daily,  Zocor 20 mg daily, Lopressor 25 mg b.i.d., Megace 40 mg b.i.d., Lisinopril 5  mg daily, Neurontin 300 mg t.i.d., Darvocet N 100 q. 4 hours p.r.n., iron  sulfate one daily.   REVIEW OF SYSTEMS:  See the history of present illness for pertinent  positives and negative, otherwise remarkable for pain associated with right  arm movement felt most likely related to the wound infection.   SOCIAL HISTORY, FAMILY HISTORY, PHYSICAL EXAMINATION:  Please see the  history and physical done at the time of admission.   HOSPITAL COURSE:  The patient was seen upon readmission by Dr. Cornelius Moras.  His  evaluation showed that there was purulent drainage and cellulitis with  likely deep sternal infection and mediastinitis although most of the wound  appeared to be healed.  His plan was to get a CT scan and further evaluate  the need for surgical intervention.   A CT exam was obtained.  It was reviewed by Dr. Cornelius Moras.  It was his opinion  upon evaluation that the patient would be served by a sternal wound  debridement and he was taken to the operating room on November 06, 2005 for  the procedure.  The procedure was tolerated well and he was taken to the  post anesthesia care unit in stable condition.   Postoperative hospital course:  The patient has done well.  Of note, the  patient's cultures obtained at the [facility] office are positive for MRSA.  He  has been treated with intravenous antibiotics.  His wound was monitored  closely and subsequently a Vac dressing has been reapplied and changed in a  routine manner.  It is showing evidence of adequate healing, however, it is  felt that this will require some additional time.  As he did not do as well  at home with  wound care and home nursing, it is Dr. Orvan July opinion that he  would best be served with again placement in a nursing facility for a short  period of time to continue vancomycin for an additional four weeks in its  intravenous form as well as continue with the Vac drainage device care.  Of  note, the patient does have anemia that is felt to be both due to chronic  disease and some degree of acute blood loss anemia and has been treated with  the Erythropoietin protocol.  Tentatively the patient is scheduled for  transfer to the nursing facility on or about November 13, 2005.   Current medications include the following:  Elavil 50 mg daily, Zocor 20 mg  daily, Metoprolol 50 mg b.i.d., Megace 40 mg b.i.d., Lisinopril 5 mg daily,  Neurontin 300 mg t.i.d., folic acid 1 mg daily, multivitamin 1 daily, iron  sulfate 325 mg t.i.d., erythropoietin 100 mcg q. Saturday with the end date  in treatment to be November 28, 2005. Q 18 hours Vancomycin is 1 gram q.  Ensure plus liquid nutritional supplement t.i.d.  Darvocet N 100 one or two  q. 4 hours p.r.n. as needed for pain or Tylox one to two q. 4 hours p.r.n.  for pain.  He is also to use a stool softener p.r.n. and antiemetics p.r.n.   CONDITION ON DISCHARGE:  Stable and improved.   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   OTHER DIAGNOSES:  Anemia.  Note:  Most recent hemoglobin and hematocrit  dated November 07, 2005, 8.6 and 25.5 respectively. Note iron stores are  also low at 21 micrograms per deciliter with TIBC 195 mcg per deciliter.  Other diagnosis are as previously listed per the history.   FOLLOW UP:  The patient should see Dr. Cornelius Moras in one week.  Please call the  office at [phone] to arrange through the nursing facility so they can obtain  transportation.  Vac care will be through usual protocols.  Additionally intravenous vancomycin should be monitored per usual protocols  with monitoring of dosing with frequent evaluations of BUN and creatinine.      Pecola Leisure, PA      Salvatore Decent. Cornelius Moras, M.D.  Electronically Signed    AY/MEDQ  D:  11/12/2005  T:  11/12/2005  Job:  [id number]   cc:   Salvatore Decent. Cornelius Moras, M.D.  Patrica Duel, M.D.  Darlin Priestly, MD

## 2010-06-27 NOTE — Op Note (Signed)
[place]. [facility]  Patient:    [name] [name], [name] [name] Visit Number: [id number] MRN: [id number]          Service Type: SUR Location: 3000 3007 01 Attending Physician:  Danella Penton Dictated by:   Tanya Nones. Jeral Fruit, M.D. Proc. Date: 03/18/01 Admit Date:  03/18/2001                             Operative Report  PREOPERATIVE DIAGNOSIS:   Recurrent L5-S1 herniated disk with degenerative disk disease and bilateral weakness.  POSTOPERATIVE DIAGNOSIS:  Recurrent L5-S1 herniated disk with degenerative disk disease and bilateral weakness.  OPERATION:  Bilateral L4-L5 diskectomy, interbody fusion L4-L5, foraminotomy to decompress the L4 and L5 nerve root.  C-arm.  Cell saver.  SURGEON:  Tanya Nones. Jeral Fruit, M.D.  ASSISTANTMena Goes. Franky Macho, M.D.  INDICATIONS:  The patient is a 55 year old gentleman, heavy smoker who has had three lumbar surgeries in the past.  Now he comes with bilateral leg pain with almost foot drop in the right side.  By MRI he has a large herniated disk central and bilateral at the level of L4-5 with degenerative disk disease. Surgery was advised.  The risks were explained.  DESCRIPTION OF PROCEDURE:  The patient was taken to the OR and he was positioned in a prone manner.  The previous operative area was prepped with Betadine.  The scar tissue was removed and incision was made between L4 and L5 bilaterally.  The patient had a quite a bit of scar tissue.  It took [place] quite a bit of dissection.  We were able to identify the facet.  Then with the drill, we drilled the level of L4 and the upper of L5.  We removed the medial facet of L4.  The patient had quite a bit of tissue and lysis to decompress the L4 and L5 nerve root was achieved.  Then we opened the disk space on the left side and a little on the right side.  A large amount of degenerative disk was removed.  Having good decompression, we entered the disk space and drilled the end  pate.  After the end plate was drilled with the help of the C-arm we made a groove and introduced a spacer and inserted a bone graft of 9 mm height.  Then in the space medial and lateral to the bone graft, autograft was used. Then our attention was to the pedicle of L4 and L5.  This was identified with the C-arm.  We introduced bone probe first later on followed by a tap applied to the four pedicle screws.  Good position was achieved using the C-arm.  Then a rod was used and the pedicle screws were tight.  Then we had a a good decompression of L4 and L5 nerve root with a solid segment.  Valsalva maneuver was negative.  The area was irrigated.  Fentanyl was left, and the wound was closed with Vicryl and nylon.  The procedure was tolerated well. Dictated by:   Tanya Nones. Jeral Fruit, M.D. Attending Physician:  Danella Penton DD:  03/19/01 TD:  03/21/01 Job: [id number]

## 2010-06-27 NOTE — Discharge Summary (Signed)
[place]. [facility]  Patient:    JACKSTON, OAXACA Visit Number: [id number] MRN: [id number]          Service Type: SUR Location: 3000 3007 01 Attending Physician:  Danella Penton Dictated by:   Tanya Nones. Jeral Fruit, M.D. Admit Date:  03/18/2001 Discharge Date: 03/21/2001                             Discharge Summary  ADMISSION DIAGNOSIS:  Recurrent L4-L5 herniated disc bilateral.  DISCHARGE DIAGNOSIS:  Recurrent L4-L5 herniated disc bilateral.  CLINICAL HISTORY:  The patient was admitted because of back and bilateral leg pain with massive weakness of the right foot.  X-ray showed herniated disc at the level of 4-5 bilaterally.  Surgery was advised.  LABORATORY:  Normal.  HOSPITAL COURSE:  The patient was taken to surgery and bilateral L4-L5 discectomy followed by interbody fusion with pedicle screw was done. Today, he is feeling much better and he is ready to go home.  The wound looks fine.  He had been afebrile.  CONDITION ON DISCHARGE:  Improved.  MEDICATIONS:  Percocet ___________.  ACTIVITY:  He is not to drive. He is not to do any heavy lifting.  FOLLOW-UP:  He is to be seen by me in my office in 10 days.  DISCHARGE INSTRUCTIONS:  The patient was encouraged to stop smoking.  The patient smoked between two and three packs of cigarettes a day. Dictated by:   Tanya Nones. Jeral Fruit, M.D. Attending Physician:  Danella Penton DD:  03/21/01 TD:  03/21/01 Job: [id number]

## 2010-06-27 NOTE — H&P (Signed)
[place]. [facility]  Patient:    [name] [name], [name] [name]                        MRN: [id number] Adm. Date:  [id number] Attending:  Danella Penton                         History and Physical  HISTORY OF PRESENT ILLNESS:  [name] [name] is a gentleman who, back on January 16th, underwent left L4-5 diskectomy.  He has only spondylolisthesis between 5-1.  At  that time, he had weakness with 2/5 dorsiflexion of the left foot.  The right leg was completely normal.  The patient was known to have spondylolisthesis between  5-1.  Nevertheless, because clinically he was having a herniated disc and radiculopathy on the left side and no pain in the right leg, just the level of -5 was done.  With the surgery, the patient did really well and he was followed by me in my office.  Later on, he developed some accumulation of spinal fluid in the subcutaneous space.  Patient did not have any headache.  I followed him in my office and indeed, the amount of fluid was no better and no worse.  Patient was  completely asymptomatic.  He came to see me last week because the swelling was getting worse and he was having some pain down to the left leg.  We drained the  fluid and we put a dressing in that area, but he called me yesterday telling me  that still he was reaccumulating fluid.  Because of that, we decided to bring him in today to investigate the area.  PAST MEDICAL HISTORY:  Negative except for a left L4-L5 diskectomy two months ago.  SOCIAL HISTORY:  He drinks socially.  He smokes two packs of cigarettes a day.  FAMILY HISTORY:  Negative.  REVIEW OF SYSTEMS:  He has a history of high cholesterol and emphysema secondary to cigarette smoking.  PHYSICAL EXAMINATION:  GENERAL:  Patient came to my office last week with his wife and indeed, clinically he was doing really well.  He has no complaints whatsoever.  HEENT:  Normal.  NECK:  Normal.  LUNGS:  There are  some distant sounds secondary to emphysema.  There are some bilateral rhonchi and wheezing.  HEART:  Heart sounds normal.  ABDOMEN:  Normal.  EXTREMITIES:  Normal pulses.  NEUROLOGIC:  Mental status normal.  Cranial nerves normal.  Strength 5/5 in the  upper and lower extremities.  Reflexes are symmetrical with decrease of the left ankle jerk.  Straight leg raising is positive at about 80 degrees.  Sensation normal.  BACK:  Examination of the lumbar spine showed that indeed the wound is well-healed. There is a midline accumulation of fluid which changes from standing to horizontal position.  CLINICAL IMPRESSION:  Cerebrospinal fluid leak at the level of L4-L5.  RECOMMENDATION:  I talked to him and his wife.  Although he is not having headache, I am really concerned that after two months, that area should be completely healed. I am going to bring him in today and explore the lumbar wound.  Ideally, we will look for the area of leakage and repair it.  He and his wife know that now because of the history of heavy smoking and because he is a poor healer, he might have he same problem all over again.  The risks are  associated with his lungs, infection and no improvement of the CSF leak. DD:  04/22/99 TD:  04/22/99 Job: [id number]

## 2010-06-27 NOTE — Consult Note (Signed)
[name], [name] NO.:  [id number]   MEDICAL RECORD NO.:  [id number]          PATIENT TYPE:  INP   LOCATION:  2312                         FACILITY:  [facility]   PHYSICIAN:  [name] [name], M.D. DATE OF BIRTH:  Jul 01, 1955   DATE OF CONSULTATION:  06/17/2005  DATE OF DISCHARGE:                                   CONSULTATION   REQUESTING PHYSICIAN:  [name] [name], M.D.   PRIMARY CARE PHYSICIAN:  [name] [name], M.D.   REASON FOR CONSULTATION:  Acute myocardial infarction with percutaneous  coronary intervention and iatrogenic acute dissection of the left anterior  descending coronary artery.   HISTORY OF PRESENT ILLNESS:  [name] [name] is a 55 year old male with history of  longstanding tobacco abuse, who was recently referred to Dr. Jenne [name] with  symptoms of chest pain suspicious for angina pectoris.  The patient was  noted to have a normal Cardiolite study in 2001.  However, recently he  developed symptoms of chest pain more suspicious for angina, including one  episode of chest pain that awakened him from his sleep and was resolved with  sublingual nitroglycerin.  The patient was subsequently brought in for  elective cardiac catheterization, which was notable for the findings of left-  dominant coronary circulation, 50-60% ostial stenosis of the left anterior  descending coronary artery and high-grade stenosis of two circumflex  marginal branches, with normal left ventricular function.  The patient was  subsequently scheduled for elective percutaneous coronary intervention and  started on oral Plavix.  The patient was brought to the cardiac  catheterization lab earlier this morning, where an attempted percutaneous  coronary intervention of the circumflex marginal branches was performed.  This was complicated by acute dissection of the left anterior descending  coronary artery with acute occlusion of this vessel.  Emergent surgical  consultation was  requested.   Review of systems has been documented previously and is briefly reviewed  here in the catheterization lab.  The patient at this point remains awake,  alert and comfortable and denies ongoing chest pain.  He denies shortness of  breath.  The remainder of his review of systems has been documented  previously by Dr. Jenne [name] as an outpatient and/or is noncontributory.   PAST MEDICAL HISTORY:  1.  Longstanding tobacco abuse.  2.  Chronic back pain with multiple previous back surgeries, for which the      patient is disabled and out of work.  3.  History of hemorrhoids.   FAMILY HISTORY:  Notable for the absence of premature coronary artery  disease.   SOCIAL HISTORY:  The patient is disabled and lives alone in [name]  He is  accompanied here to the hospital by his sister.  He smokes between 1-1/2 and  two packs of cigarettes per day.  He reports alcohol consumption usually on  weekends only.  He states that physically he is able to get around  reasonably well despite his chronic problems with back pain.   PHYSICAL EXAMINATION:  GENERAL:  The patient is examined briefly in the  cardiac catheterization lab, where  he remains on the table.  VITAL SIGNS:  He is in normal sinus rhythm with blood pressure between 150  and 180 mmHg systolic.  He is breathing comfortably.  HEENT:  Notable for poor dentition.  NECK:  Supple.  There is no jugular venous distension.  No carotid bruits  noted.  CHEST:  Auscultation of the chest reveals clear breath sounds anteriorly  with a few crackles.  No wheezes or rhonchi are noted.  CARDIOVASCULAR:  Notable for regular rate and rhythm.  No murmurs, rubs or gallops are noted.  ABDOMEN:  Soft, nondistended, nontender.  EXTREMITIES:  Warm and adequately perfused.  Intra-aortic balloon pump has  just been placed via the left common femoral approach.  There is a sheath on  the right side with guidewire in place down the left anterior descending   coronary artery.  RECTAL, GENITOURINARY:  Exams are both deferred.  NEUROLOGIC:  Grossly nonfocal.   DIAGNOSTIC TESTS:  Cardiac catheterization performed by Dr. Jenne [name] is  reviewed.  This demonstrates left-dominant coronary circulation.  There is  long segment 50-60% ostial stenosis of the left anterior descending coronary  artery prior to onset of the attempted percutaneous coronary intervention.  There were two small to medium-sized circumflex marginal branches with 90%  proximal stenosis in these lesions.  The left circumflex coronary artery is  otherwise a large vessel and gives rise to the posterior descending coronary  artery.  There is 60% stenosis of the posterior descending coronary artery.  Left ventricular function was normal prior to percutaneous coronary  intervention.  After an attempted percutaneous coronary intervention, there  appears to be an acute spiral dissection involving the proximal left  anterior descending coronary artery with TIMI-1 antegrade flow through this  vessel.   IMPRESSION:  Acute left anterior descending coronary artery occlusion  related to acute spiral dissection of this vessel as a complication of  attempted percutaneous coronary intervention of the circumflex marginal  branches.  I believe that [name] [name] would best be treated by emergency  surgical revascularization.   PLAN:  I have discussed issues briefly with [name] [name] here in the  catheterization lab.  Alternative strategies have been reviewed.  All of his  questions have been addressed.  We tentatively plan to proceed directly to  the operating room for surgery.  He understands and accepts all associated  risks of surgery including but not limited to risk of death, stroke,  myocardial infarction, congestive heart failure, respiratory failure,  pneumonia, bleeding requiring blood transfusion or possible return to the operating room for control, arrhythmia, infection, and late recurrence  of  coronary artery disease.  All of his questions been addressed.      [name] [name], M.D.  Electronically Signed     CHO/MEDQ  D:  06/17/2005  T:  06/18/2005  Job:  [id number]   cc:   [name] Priestly, MD  Fax: [phone]   [name], M.D.  Fax: [phone]

## 2010-06-27 NOTE — Discharge Summary (Signed)
NAMEKINGSTON, [name]:  [id number]   MEDICAL RECORD NO.:  [id number]          PATIENT TYPE:  INP   LOCATION:  2038                         FACILITY:  [facility]   PHYSICIAN:  Salvatore Decent. Cornelius Moras, M.D. DATE OF BIRTH:  [birth year]   DATE OF ADMISSION:  06/17/2005  DATE OF DISCHARGE:                                 DISCHARGE SUMMARY   ADMISSION DIAGNOSIS:  Three-vessel coronary artery disease with  acute  angioplasty failure and acute dissection of left anterior descending  coronary artery with acute evolving myocardial infarction.   SECONDARY DIAGNOSES:  1.  Three-vessel coronary artery disease with  acute angioplasty failure and      acute dissection of left anterior descending coronary artery with acute      evolving myocardial infarction, status post emergency coronary artery      bypass grafting x3.  2.  Chronic obstructive pulmonary disease with ongoing tobacco use.  3.  History of L4-L5 diskectomy x3.  4.  Hypertension.  5.  Dyslipidemia.  6.  Chronic back pain for which he is disabled and out of work.  7.  History of hemorrhoids.  8.  No known drug allergies.  9.  Bleeding status post right coronary artery bypass graft requiring      intraoperative re-exploration.  10. Postoperative coagulopathy, corrected.  11. Mild postoperative hyperglycemia, improved.  12. Small bilateral pneumothorax postoperatively.   PROCEDURES:  1.  May 9, cardiac catheterization with coronary angiography of obtuse      marginal one/proximal percutaneous transluminal coronary angioplasty,      left anterior descending, proximal percutaneous transluminal coronary      angioplasty, insertion of anterior balloon pump.  Successful      percutaneous transluminal coronary angioplasty of the obtuse marginal      one, but complicated with spontaneous dissection of the proximal left      anterior descending and subsequent percutaneous transluminal coronary      angioplasty of left anterior  descending by Dr. Lenise Herald.  2.  Emergency median sternotomy for coronary artery bypass grafting x3 using      a left internal mammary artery to the distal left anterior descending      coronary artery, saphenous vein graft to the second circumflex marginal      branch, saphenous vein graft to the left posterior descending coronary      artery, endoscopic saphenous vein harvest from the right thigh and lower      leg.  Surgeon Dr. Tressie Stalker.  Also, May 9.  Mediastinal      reexploration for bleeding by Dr. Cornelius Moras.   BRIEF HISTORY:  [name] [name] is a 55 year old Caucasian male with history of  longstanding tobacco abuse who was recently referred to Dr. Jenne Campus with  symptoms of chest pain suspicious for angina pectoris.  The patient was  noted to have a normal Cardiolite study in 2001, however, recently he  develops symptoms of chest pain more suspicious for angina including one  episode of chest pain that awakened him from sleep and was resolved with  sublingual nitroglycerin.  He was subsequently brought for elective cardiac  catheterization with findings notable of a left dominant coronary  circulation, 50-60% ostial stenosis of the left anterior descending coronary  artery and high-grade stenosis of two circumflex marginal branches with  normal left ventricular function.  The patient was subsequently scheduled  for elective percutaneous coronary intervention and was started on oral  Plavix.  The patient was brought to the cardiac catheterization lab on the  morning of Jun 17, 2005 where an attempted percutaneous coronary intervention  of the circumflex marginal branches was performed which was complicated by  acute dissection of the left anterior descending coronary artery with  occlusion of this vessel.  Emergent surgical consultation was requested.   HOSPITAL COURSE:  As mentioned, [name] underwent cardiac catheterization  complicated by acute dissection of the left anterior  descending coronary  artery.  He was evaluated by cardiac surgeon, Dr. Tressie Stalker and he felt  this should be and should be treated emergently with surgical  revascularization.  After discussing risks, benefits with the patient and  family, he was taken to the operating room for emergent coronary artery  bypass graft surgery as previously discussed.  Postoperatively, he was taken  to the surgical intensive care unit, however, by the afternoon although he  remained hemodynamically stable, he was hypothermic at 34 degrees Celsius  and still coagulopathic with an increase in chest tube output.  His PT was  36.6 with INR 3.7, PTT 137.  He was treated with cryoprecipitate and fresh  frozen plasma for his coagulopathy as well as platelets and packed red blood  cells.  Ultimately, however, he was taken to the operating room for re-  exploration for bleeding.  Findings showed diffuse coagulopathy but no  mechanical sources of bleeding.  He then returned to the surgical intensive  care unit and was successfully extubated by the following morning.  By this  time, his coagulopathy had resolved.  He was requiring Neo-Synephrine drip  and the intra-aortic balloon with a ratio 1:3 was discontinued later that  day.  His chest tubes remained until postoperative day #3 due to excessive  drainage for removal.  His follow-up chest x-ray did show small bilateral  pneumothoraces at 5% on the left and 10% on the right.  He also had some  asymmetric pulmonary edema on the left but otherwise was in no acute  respiratory distress with only complaints of mild dyspnea on exertion.  He  did have some scattered wheezes and a Combivent inhaler was initiated.  A  smoking cessation consult was also ordered as well.   [name] [name] was transferred out of the surgical intensive care unit to  telemetry 2000 on postop day #3.  By this time, he was weaned from IV drips and invasive monitoring lines had been discontinued.  It  was anticipated he  would remain on the unit until discharge.  Since transfer, both his  hematocrit and platelet counts have both increased.  He has been started on  short-term diuretic therapy for mild postoperative fluid volume excess.  He  reports adequate urine output and kidney function has been stable with  creatinine 1.1.  He did have some postoperative constipation which was  treated with laxatives.  He also had some mild sinus tachycardia with heart  rate between 110-120 require further increase in his beta blocker therapy.  His blood pressure has remained stable off Neo-Synephrine and has been able  to tolerate beta blocker therapy.  An ACE  inhibitor will be added at the  later time as his blood pressure allows.  He has been seen by cardiac rehab  and was able to ambulate the hallways with assistance having a steady gait.  He has required some supplemental oxygen.  He did initially require  supplemental oxygen at 2 L per nasal cannula but was currently saturating  91% on room air.  He did have also some mild hyperglycemia postoperatively  with at least one sugar on the day of surgery elevated at 258, however, his  postoperative sugars were much better, ranging around 90-120.  He did  receive some education from the dietitian regarding carbohydrate-modified  and heart-healthy diet.  A multivitamin and dietary supplement was also  recommend and he will receive Resource as well as Boost.  If [name]  continues to make good progress and his pulmonary status remained stable and  his heart rate shows a good response to that blocker therapy, it as  anticipated he may be ready for discharge as early as postoperative day 5 or  6 which would be May 14 or 15.   LABORATORY DATA:  Most recent labs show a white blood count 6.4, hemoglobin  11.1, hematocrit 31.1, platelet count rising at 114.  Sodium 135, potassium  3.9, chloride 97, CO2 28, BUN 9, creatinine 1.1, blood glucose 99, AST 28,   ALT 25, alkaline phosphatase 73, total bilirubin 0.6 albumin 3.1, calcium  8.6.  Last PT/INR were 14.9 and 1.2 respectively with normal PT of 44.   A follow-up chest x-ray has to follow up his bilateral small pneumothorax  has been ordered but is pending at the time of this dictation.  External  pacing wires and chest tube sutures to be removed prior to discharge.   DISCHARGE MEDICATIONS:  1.  Aspirin 325 mg p.o. daily.  2.  Lopressor 25 mg p.o. b.i.d.  3.  Zocor 20 mg p.o. q.p.m.  4.  Combivent inhaler 2 puffs q.i.d.  5.  Neurontin 300 mg p.o. t.i.d.  6.  Elavil 50 mg p.o. q.h.s.  7.  Multivitamin one p.o. daily.  8.  Lasix 40 mg p.o. daily x3 days.  9.  K-Dur 20 mEq p.o. daily x3 days  10. Percocet 5/325 mg one to two tablets p.o. q.4h. p.r.n. pain.   DISCHARGE INSTRUCTIONS:  He was instructed to avoid driving or lifting more  than 10 pounds.  He is encouraged to continue daily walking and breathing exercises.  He is to follow a low-fat, low-salt diet.  He may shower and  clean incisions gently with soap and water.  He is to notify the [facility] office  if he develops fever greater than 101 or redness or drainage from his  incision site.   FOLLOW UP:  1.  He is follow with Dr. Tressie Stalker in the [facility] office in approximately      3 weeks.  The [facility] office will contact him regarding specific      appointment date and time.  He is to also have a chest x-ray at      [facility] one hour before his appointment.  2.  He is to call and schedule a two-week followup with Dr. Jenne Campus.      Jerold Coombe, P.A.      Salvatore Decent. Cornelius Moras, M.D.  Electronically Signed    AWZ/MEDQ  D:  06/21/2005  T:  06/22/2005  Job:  [id number]   cc:   Patrica Duel, M.D.  Fax:  [phone] 

## 2010-06-27 NOTE — Op Note (Signed)
NAMELEQUAN, DOBRATZ NO.:  [id number]   MEDICAL RECORD NO.:  [id number]          PATIENT TYPE:  INP   LOCATION:  2312                         FACILITY:  [facility]   PHYSICIAN:  Salvatore Decent. Cornelius Moras, M.D. DATE OF BIRTH:  1955-08-11   DATE OF PROCEDURE:  06/17/2005  DATE OF DISCHARGE:                                 OPERATIVE REPORT   PREOPERATIVE DIAGNOSIS:  3-vessel coronary artery disease with acute  angioplasty failure and acute dissection of the left anterior descending  coronary artery, acute evolving myocardial infarction   POSTOPERATIVE DIAGNOSIS:  3-vessel coronary artery disease with acute  angioplasty failure and acute dissection of the left anterior descending  coronary artery, acute evolving myocardial infarction   PROCEDURE:  Emergency median sternotomy for coronary artery bypass grafting  x3 (left internal mammary artery to distal left anterior descending coronary  artery, saphenous vein graft to second circumflex marginal branch, saphenous  vein graft to the left posterior descending coronary artery, endoscopic  saphenous vein harvest from right thigh and right lower leg).   SURGEON:  Salvatore Decent. Cornelius Moras, M.D.   ASSISTANT:  Baxter Hire Dominick   ANESTHESIA:  General.   BRIEF CLINICAL NOTE:  The patient is a 55 year old male who presents with  symptoms of angina and underwent cardiac catheterization by Dr. Lenise Herald demonstrating 3-vessel coronary artery disease with normal left  ventricular function and left dominant coronary circulation.  The patient  was subsequently brought in for elective percutaneous coronary intervention  on the morning of May9,2007.  Attempts at percutaneous coronary intervention  of the circumflex marginal branches were performed, but this was complicated  by acute dissection of the proximal left anterior descending coronary  artery.  Emergent surgical revascularization was felt indicated.  A full  consultation note has been  dictated previously.  The patient was started on  oral Plavix as an outpatient prior to an attempt at percutaneous coronary  intervention.  The patient provides full, informed consent for the procedure  as described and all of their questions have been addressed as plans were  made to proceed directly from the cath lab to the operating room.   OPERATIVE FINDINGS:  1.  Normal left ventricular function.  2.  Good-quality left internal mammary artery and saphenous vein conduit.  3.  Good-quality target vessels for bypass grafting.  4.  Severe coagulopathy.   OPERATIVE NOTE IN DETAIL:  The patient is brought directly from the cath lab  to the operating room on the morning of May9,2007.  The patient is placed in  the supine position on the operating table.  Intravenous antibiotics are  administered.  Central monitoring is established by the anesthesia service  under the care and direction of Judie Petit, M.D.  Specifically, a Swan-  Ganz catheter is placed through the right internal jugular approach.  A  radial arterial line is placed.  The existing guidewire through the left  femoral artery sheath placed into the left anterior descending coronary  artery is removed.  General endotracheal anesthesia is induced uneventfully.  The patient's chest, abdomen, both groins, and  both lower extremities are  prepared and draped in a sterile manner.   Baseline transesophageal echocardiogram is performed by Dr. Randa Evens.  This  demonstrates mild left ventricular dysfunction with no significant wall  motion abnormalities.  No other significant abnormalities are noted.   A median sternotomy incision is performed and the left internal mammary  artery is dissected from the chest wall and prepared for bypass grafting.  The left internal mammary artery is good-quality conduit.  Simultaneously  the saphenous vein is obtained from the patient's right thigh and the upper  portion of right lower leg using  endoscopic vein harvest technique.  The  saphenous vein was a good-quality conduit.  After the saphenous vein is  removed, the small surgical incisions in the right lower extremity are  closed in multiple layers with running absorbable suture.   The patient is heparinized systemically.  The pericardium is opened.  The  ascending aorta is normal in appearance.  The ascending aorta and the right  atrium are cannulated for cardiopulmonary bypass.  A retrograde cardioplegic  catheter is placed through the right atrium and into the coronary sinus.  Cardiopulmonary bypass is begun and the surface of the heart is inspected.  Distal sites are selected for coronary artery bypass grafting.  The first  circumflex marginal branch is too small for grafting.  There is no blood in  the pericardial space.  There is diffuse coronary artery disease with a left  dominant coronary circulation.  A temperature probe is placed in the left  ventricular septum.  A cardioplegic catheter is placed in the left  ventricular septum.  A cardioplegic catheter is placed in the ascending  aorta.   The patient is allowed to cool passively to 32 degrees systemic temperature.  The aortic crossclamp is applied and cold blood cardioplegia is administered  initially in an antegrade fashion through the aortic root.  Iced saline  slush is applied for topical hypothermia.  Supplemental cardioplegia is  administered retrograde through the coronary sinus catheter.  The initial  cardioplegic arrest and myocardial cooling are felt to be excellent.  Repeat  doses of cardioplegia are administered intermittently throughout the  crossclamp portion of the operation through the aortic root, down the  subsequently placed vein grafts, and retrograde through the coronary sinus  catheter to maintain left ventricular septal temperature below 15 degrees  centigrade.  The following distal coronary anastomoses are performed:  1.  The posterior  descending coronary artery which is a branch off the      distal left circumflex coronary artery is grafted with a saphenous vein      graft in an end-to-side fashion.  This vessel measures 1.5 mm in      diameter and is good quality at the site of distal bypass.  There is 60%      proximal stenosis of this vessel.  2.  The second circumflex marginal branch is grafted with a saphenous vein      graft in an end-to-side fashion.  This vessel measures 1.3 mm in      diameter and is good quality at the site of distal bypass.  There is 90%      proximal stenosis.  3.  The distal left anterior descending coronary artery is grafted with a      left internal mammary artery in an end-to-side fashion.  This vessel      measures 2 mm in diameter and is good quality at the site of  distal      bypass.  This vessel is subtotally occluded die to acute coronary      dissection proximally.   Both proximal saphenous vein anastomoses are performed directly to the  ascending aorta prior to removal of the aortic crossclamp.  The left  ventricular septal temperature rises rapidly with reperfusion of the left  internal mammary artery.  One final dose of warm retrograde hot shot  cardioplegia is administered.  The aortic crossclamp is removed after a  total crossclamp time of 56 minutes.  The heart begins to beat  spontaneously.  Normal sinus rhythm resumes.  All proximal and distal  coronary anastomoses are inspected for hemostasis and appropriate graft  orientation.  Epicardial pacing wires are fixed from the right ventricular  outflow tract into the right atrial appendage.  The patient is rewarmed to  37 degrees centigrade temperature.  The patient is weaned from  cardiopulmonary bypass without difficulty.  The patient's rhythm at  separation from bypass is normal sinus rhythm.  No inotropic support is  required.  Total cardiopulmonary bypass time for the operation is 70  minutes.  Follow-up transesophageal  echocardiogram performed by Dr. Randa Evens  after separation from bypass demonstrates normal left ventricular function.   The venous and arterial cannulae are removed uneventfully.  Protamine is  administered to reverse the anticoagulation.  The mediastinum and the left  chest are irrigated with saline solution containing vancomycin.  Meticulous  surgical hemostasis is ascertained.  There is severe coagulopathy.  The  patient is thrombocytopenic with platelet count 70,000 intraoperatively.  The patient is transfused one 10-pack of adult platelets.  An exhaustive  search for sites of mechanical bleeding is entertained.  The mediastinum and  the left chest are drained using 3 chest tubes exited through separate stab  incisions inferiorly.  The soft tissues anterior to the aorta are  reapproximated loosely.  The sternum was closed with double-strength sternal wire.  The soft tissues anterior to the sternum are closed in multiple  layers; and the skin is closed with a running subcuticular skin closure.   The patient tolerated the procedure well and is transported to the surgical  intensive care unit in stable condition.  There are no intraoperative  complications.  All sponge, instrument, and needle counts are verified  correct at completion of the operation.      Salvatore Decent. Cornelius Moras, M.D.  Electronically Signed     CHO/MEDQ  D:  06/17/2005  T:  06/18/2005  Job:  [id number]

## 2010-06-27 NOTE — Discharge Summary (Signed)
[place]. [facility]  Patient:    [name] [name], [name] [name]                        MRN: [id number] Adm. Date:  [id number] Disc. Date: [id number] Attending:  Danella Penton                           Discharge Summary  ADMITTING DIAGNOSIS:  Lumbar cerebrospinal fluid leak.  FINAL DIAGNOSIS:  Lumbar cerebrospinal fluid leak.  HISTORY OF PRESENT ILLNESS:  The patient had surgery on February 25, 1999, for a  left L4-5 diskectomy.  The patient did well.  The pain went away, and so did the weakness.  Nevertheless, he developed some accumulation of fluid in the lumbar rea not associated with any headache.  I have been following him in my office. There has been no evidence of any fluid coming through the skin.  Because the area has been increasing in size, we decided to take him back to surgery to explore the area.  LABORATORY DATA:  Normal.  HOSPITAL COURSE:  The patient was taken to surgery, and exploration at L4-L5 showed that, indeed, there was an area around the dorsal aspect of the dural matter which was friable.  There was a small opening.  Closure was achieved and, also, this as followed by ______ .  Today he is doing much better, and he is ready to go home.  CONDITION ON DISCHARGE:  Improved.  MEDICATIONS:  Percocet, diazepam.  DIET:  Regular.  ACTIVITY:  He is not to drive, he is not to do any lifting.  FOLLOW-UP:  To be seen by me in 10 days. DD:  04/25/99 TD:  04/25/99 Job: [id number]

## 2010-06-27 NOTE — Op Note (Signed)
[place]. [facility]  Patient:    [name] [name]                         MRN: [id number] Proc. Date: 02/25/99 Adm. Date:  [id number] Attending:  Danella Penton                           Operative Report  PREOPERATIVE DIAGNOSES: 1. Left lumbar vertebrae-4/5 herniated disc. 2. Lumbar vertebrae-5/sacral vertebrae-1 spondylolisthesis.  POSTOPERATIVE DIAGNOSES: 1. Left lumbar vertebrae-4/5 herniated disc. 2. Lumbar vertebrae-5/sacral vertebrae-1 spondylolisthesis.  OPERATION:  Left L4/5 discectomy, foraminotomy, removal of free fragment.  INSTRUMENTS:  Midas Rex and microscope.  SURGEON:  Tanya Nones. Jeral Fruit, M.D.  ASSISTANT:  Alanson Aly. Roxan Hockey, M.D.  INDICATION:  The patient had been seen by me for almost a year and lately he is  getting worse.  The pain goes down to the left leg.  Myelogram shows that he has a herniated disc at the level of L4/5 on the left and a spondylolisthesis between  L5-S1 which does not move with flexion and extension.  Surgery is being done to  address the disc between L4/5.  The patient knows about the risks such as need for further surgery, CSF leak, worsening pain, paralysis, infection.  DESCRIPTION OF PROCEDURE:  The patient was taken to the OR and he was positioned in a prone manner.  The back was prepped with Betadine.  A midline incision was made.  X-rays showed that indeed we were at the level of  L4/5.  We brought the microscope into the area and with the Midas Rex drill we drilled part of the lamina of L4 and L5.  A thick, yellow ligament was also excised.  We identified the L5 nerve root which was adherent to the canal.  Lysis was done. There was a free fragment going to the foramen.  We entered the disc space and  large amount of degenerative disc was removed.  Decompression was done medial.  Investigation of the L4 and L5 was clean.  At the end, the area was irrigated.  Fentanyl and Depo-Medrol  were left in the epidural space and the wound was closed with Vicryl and Steri-Strips. DD:  02/25/99 TD:  02/25/99 Job: [id number]

## 2010-06-27 NOTE — H&P (Signed)
[place]. [facility]  Patient:    [name] [name]                         MRN: [id number] Adm. Date:  [id number] Attending:  Danella Penton                         History and Physical  HISTORY OF PRESENT ILLNESS:  [name] [name] is a gentleman who was seen by me almost a year ago because of back pain since Christmas 1999.  I saw him initially on March 04, 1998 and conservative treatment was given.  Again, he came to see me at the beginning of this year, having a lot of pain, being miserable, pain going to the left leg, and according to him the pain was killing him.  The patient has failed conservative treatment.  Because of the findings, we brought him for a lumbar myelogram.  We knew that he had spondylolithesis between 5-1.  After we did the myelogram, I brought him to my office with his sister and we agreed with surgery.  He denies any pain going to the right leg.  PAST MEDICAL HISTORY:  Negative.  SOCIAL HISTORY:  The patient smokes two packs a day.  He drinks socially.  FAMILY HISTORY:  Negative.  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  PHYSICAL EXAMINATION:  GENERAL:  The patient came into my office limping from the left leg.  He had quite a bit of difficulty sitting.  HEENT:  Normal.  NECK:  He has good flexibility.  LUNGS:  There are some distant sounds and rhonchi bilaterally.  HEART:  Heart sounds normal.  ABDOMEN:  Normal.  EXTREMITIES:  Normal pulses.  NEUROLOGIC:  Mental status normal.  The patient is oriented x 3.  Cranial nerves: Pupils 3 mm, equal, and reactive.  He has full ocular movements.  Face symmetrical. Swallowing normal.  Hearing is normal.  Strength in the upper extremities normal. The right leg is normal.  In the left leg, he has a 2/5 weakness in dorsiflexion in the left foot.  The patient cannot stand on tiptoes or heels.  He had  ______ in  the right side positive at 50 degrees with pain in the left hip and the left leg is positive at 30 degrees.  He has positive sciatic notch tenderness.  Sensation: He complains of numbness, mostly involving the left foot, especially the dorsum. he myelogram showed that indeed he has a large herniated disk at L4-5 central and o the left.  At the level of 5-1, he has spondylolithesis to 5-1, which does not move.  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  RECOMMENDATIONS:  I talked to the patient and his sister at length.  Since he is clinically symptomatic of L4-5 disk, we are going to proceed with L4-5 diskectomy. He knows, and the risk of course is, that the spondylolithesis will become unstable in years to come, but since we are doing all the surgery at 4-5 and not 5-1, that is a small possibility, but nevertheless still there.  I do not think at the present that we will need to address the spondylolithesis.  The risk with the disk itself would be CSF leak, worsening of pain, no improvement whatsoever, need for further surgery, infection. DD:  02/25/99 TD:  02/25/99 Job: [id number]

## 2010-06-27 NOTE — H&P (Signed)
[name]. [facility]  Patient:    [name] [name], [name] [name]                        MRN: [id number] Adm. Date:  [id number] Attending:  Danella Penton                         History and Physical  CHIEF COMPLAINT: [name] [name] is a gentleman who about a year ago underwent surgery at the L4-5 level.  HISTORY OF PRESENT ILLNESS: I have been following him in my office with the patient having had good days and bad days.  Lately he says the pain is getting worse.  We knew prior to surgery that he had spondylolisthesis between L5-S1 which did not move.  Nevertheless, the patient continued to have pain and he wants to proceed with surgery because he is not any better.  The surgery of this gentleman was done before at the level of L5-S1 and now he has compromise mostly at the level of the L5 nerve root secondary to spondylosis.  PAST MEDICAL/SURGICAL HISTORY: Diskectomy back in March 2001.  SOCIAL HISTORY: He smokes two packs of cigarettes a day.  He drinks socially.  FAMILY HISTORY: Negative.  REVIEW OF SYSTEMS: He has a history of high cholesterol and emphysema secondary to cigarette smoking.  I have advised him on several occasions to stop smoking but, according to him, it is impossible to do that.  PHYSICAL EXAMINATION:  GENERAL: The patient came to my office and he was limping on his leg.  HEENT: Normal.  NECK: Normal.  LUNGS: Bilateral rhonchi and wheezing, and distant sounds secondary to emphysema.  CARDIAC: Heart sounds normal.  ABDOMEN: Normal.  EXTREMITIES: Normal pulses.  NEUROLOGIC: Mental status normal.  Cranial nerves normal.  Strength 5/5 except for some weakness at the level of dorsiflexion of the left foot.  Sensation normal except for some numbness in the dorsum of the foot.  Straight leg raising is positive bilaterally, on the right side about 60 degrees and on the left side about 30 degrees.  He has decreased flexion of the lumbar  spine.  LABORATORY DATA: X-ray shows that indeed he has herniated disk at L4-5 on the left side.  CLINICAL IMPRESSION: L4-5 herniated disk.  PLAN: The patient is being admitted for surgery.  He knows of the risks such as infection, CSF leak, worsening pain, paralysis, poor healing secondary to his COPD. DD:  05/04/00 TD:  05/04/00 Job: [id number]

## 2010-11-26 LAB — DIFFERENTIAL
Eosinophils Absolute: 0.3
Eosinophils Relative: 4
Lymphs Abs: 0.8
Monocytes Absolute: 0.2
Monocytes Relative: 3

## 2010-11-26 LAB — CBC
MCHC: 34.5
RBC: 3.36 — ABNORMAL LOW
RDW: 13.2

## 2010-11-26 LAB — COMPREHENSIVE METABOLIC PANEL
ALT: 20
AST: 35
Calcium: 9
GFR calc Af Amer: >60
Sodium: 139
Total Protein: 6.1

## 2010-11-26 LAB — URINALYSIS, ROUTINE W REFLEX MICROSCOPIC
Bilirubin Urine: NEGATIVE
Glucose, UA: NEGATIVE
Ketones, ur: NEGATIVE
pH: 6

## 2016-09-23 ENCOUNTER — Telehealth: Payer: Self-pay | Admitting: Internal Medicine

## 2016-09-23 NOTE — Telephone Encounter (Signed)
Letter mailed to pt.  

## 2016-09-23 NOTE — Telephone Encounter (Signed)
Recall for tcs °
